# Patient Record
Sex: Female | Born: 1975 | Race: Black or African American | Hispanic: No | Marital: Single | Smoking: Never smoker
Health system: Southern US, Community
[De-identification: ages and names within clinical notes are randomized; demographics above are authoritative.]

## PROBLEM LIST (undated history)

## (undated) DIAGNOSIS — K219 Gastro-esophageal reflux disease without esophagitis: Secondary | ICD-10-CM

## (undated) DIAGNOSIS — F329 Major depressive disorder, single episode, unspecified: Secondary | ICD-10-CM

## (undated) DIAGNOSIS — K760 Fatty (change of) liver, not elsewhere classified: Secondary | ICD-10-CM

## (undated) DIAGNOSIS — G8929 Other chronic pain: Secondary | ICD-10-CM

## (undated) DIAGNOSIS — E119 Type 2 diabetes mellitus without complications: Secondary | ICD-10-CM

## (undated) DIAGNOSIS — R51 Headache: Secondary | ICD-10-CM

## (undated) DIAGNOSIS — J45909 Unspecified asthma, uncomplicated: Secondary | ICD-10-CM

## (undated) DIAGNOSIS — M549 Dorsalgia, unspecified: Secondary | ICD-10-CM

## (undated) DIAGNOSIS — R519 Headache, unspecified: Secondary | ICD-10-CM

## (undated) DIAGNOSIS — R87629 Unspecified abnormal cytological findings in specimens from vagina: Secondary | ICD-10-CM

## (undated) DIAGNOSIS — J302 Other seasonal allergic rhinitis: Secondary | ICD-10-CM

## (undated) DIAGNOSIS — I10 Essential (primary) hypertension: Secondary | ICD-10-CM

## (undated) DIAGNOSIS — G43909 Migraine, unspecified, not intractable, without status migrainosus: Secondary | ICD-10-CM

## (undated) DIAGNOSIS — G4733 Obstructive sleep apnea (adult) (pediatric): Secondary | ICD-10-CM

## (undated) DIAGNOSIS — F32A Depression, unspecified: Secondary | ICD-10-CM

## (undated) HISTORY — DX: Type 2 diabetes mellitus without complications: E11.9

## (undated) HISTORY — DX: Obstructive sleep apnea (adult) (pediatric): G47.33

## (undated) HISTORY — DX: Fatty (change of) liver, not elsewhere classified: K76.0

## (undated) HISTORY — DX: Dorsalgia, unspecified: M54.9

## (undated) HISTORY — DX: Migraine, unspecified, not intractable, without status migrainosus: G43.909

## (undated) HISTORY — PX: NO PAST SURGERIES: SHX2092

## (undated) HISTORY — PX: CHOLECYSTECTOMY: SHX55

## (undated) HISTORY — DX: Unspecified asthma, uncomplicated: J45.909

## (undated) HISTORY — DX: Other seasonal allergic rhinitis: J30.2

## (undated) HISTORY — DX: Unspecified abnormal cytological findings in specimens from vagina: R87.629

---

## 2009-08-08 ENCOUNTER — Emergency Department: Payer: Self-pay | Admitting: Unknown Physician Specialty

## 2011-03-21 ENCOUNTER — Emergency Department: Payer: Self-pay | Admitting: Emergency Medicine

## 2014-04-30 DIAGNOSIS — F4312 Post-traumatic stress disorder, chronic: Secondary | ICD-10-CM | POA: Insufficient documentation

## 2014-04-30 DIAGNOSIS — F4310 Post-traumatic stress disorder, unspecified: Secondary | ICD-10-CM | POA: Insufficient documentation

## 2015-03-02 DIAGNOSIS — F4323 Adjustment disorder with mixed anxiety and depressed mood: Secondary | ICD-10-CM | POA: Insufficient documentation

## 2015-05-25 LAB — HM HIV SCREENING LAB: HM HIV Screening: NEGATIVE

## 2016-03-01 ENCOUNTER — Emergency Department: Payer: No Typology Code available for payment source

## 2016-03-01 ENCOUNTER — Emergency Department
Admission: EM | Admit: 2016-03-01 | Discharge: 2016-03-01 | Disposition: A | Discharge status: Home / Self Care | Payer: No Typology Code available for payment source | Attending: Emergency Medicine | Admitting: Emergency Medicine

## 2016-03-01 ENCOUNTER — Encounter: Payer: Self-pay | Admitting: Emergency Medicine

## 2016-03-01 DIAGNOSIS — S39012A Strain of muscle, fascia and tendon of lower back, initial encounter: Secondary | ICD-10-CM | POA: Diagnosis not present

## 2016-03-01 DIAGNOSIS — S301XXA Contusion of abdominal wall, initial encounter: Secondary | ICD-10-CM | POA: Diagnosis not present

## 2016-03-01 DIAGNOSIS — Y939 Activity, unspecified: Secondary | ICD-10-CM | POA: Diagnosis not present

## 2016-03-01 DIAGNOSIS — Y92410 Unspecified street and highway as the place of occurrence of the external cause: Secondary | ICD-10-CM | POA: Diagnosis not present

## 2016-03-01 DIAGNOSIS — Y999 Unspecified external cause status: Secondary | ICD-10-CM | POA: Insufficient documentation

## 2016-03-01 DIAGNOSIS — G44319 Acute post-traumatic headache, not intractable: Secondary | ICD-10-CM | POA: Insufficient documentation

## 2016-03-01 DIAGNOSIS — S3992XA Unspecified injury of lower back, initial encounter: Secondary | ICD-10-CM | POA: Diagnosis present

## 2016-03-01 HISTORY — DX: Headache: R51

## 2016-03-01 HISTORY — DX: Other chronic pain: G89.29

## 2016-03-01 HISTORY — DX: Headache, unspecified: R51.9

## 2016-03-01 HISTORY — DX: Major depressive disorder, single episode, unspecified: F32.9

## 2016-03-01 HISTORY — DX: Depression, unspecified: F32.A

## 2016-03-01 HISTORY — DX: Gastro-esophageal reflux disease without esophagitis: K21.9

## 2016-03-01 LAB — POCT PREGNANCY, URINE: Preg Test, Ur: NEGATIVE

## 2016-03-01 MED ORDER — MELOXICAM 15 MG PO TABS: 15.0000 mg | ORAL_TABLET | Freq: Every day | ORAL | 0 refills | Status: DC

## 2016-03-01 MED ORDER — METHOCARBAMOL 500 MG PO TABS: 500.0000 mg | ORAL_TABLET | Freq: Four times a day (QID) | ORAL | 0 refills | Status: DC

## 2016-03-01 NOTE — ED Notes (Signed)
Discharge instructions reviewed with patient. Patient verbalized understanding. Patient ambulated to lobby without difficulty.   
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
## 2017-09-12 DIAGNOSIS — D219 Benign neoplasm of connective and other soft tissue, unspecified: Secondary | ICD-10-CM | POA: Insufficient documentation

## 2018-07-16 ENCOUNTER — Other Ambulatory Visit: Payer: Self-pay

## 2018-07-16 ENCOUNTER — Ambulatory Visit
Admission: EM | Admit: 2018-07-16 | Discharge: 2018-07-16 | Disposition: A | Discharge status: Home / Self Care | Payer: Self-pay | Attending: Family Medicine | Admitting: Family Medicine

## 2018-07-16 ENCOUNTER — Encounter: Payer: Self-pay | Admitting: Emergency Medicine

## 2018-07-16 DIAGNOSIS — I10 Essential (primary) hypertension: Secondary | ICD-10-CM

## 2018-07-16 DIAGNOSIS — R109 Unspecified abdominal pain: Secondary | ICD-10-CM | POA: Insufficient documentation

## 2018-07-16 DIAGNOSIS — R82998 Other abnormal findings in urine: Secondary | ICD-10-CM | POA: Insufficient documentation

## 2018-07-16 HISTORY — DX: Essential (primary) hypertension: I10

## 2018-07-16 LAB — URINALYSIS, COMPLETE (UACMP) WITH MICROSCOPIC
BACTERIA UA: NONE SEEN
BILIRUBIN URINE: NEGATIVE
Glucose, UA: NEGATIVE mg/dL
Hgb urine dipstick: NEGATIVE
KETONES UR: NEGATIVE mg/dL
Nitrite: NEGATIVE
RBC / HPF: NONE SEEN RBC/hpf (ref 0–5)
Specific Gravity, Urine: 1.02 (ref 1.005–1.030)
Squamous Epithelial / LPF: NONE SEEN (ref 0–5)
pH: 7 (ref 5.0–8.0)

## 2018-07-16 MED ORDER — HYDROCODONE-ACETAMINOPHEN 5-325 MG PO TABS: ORAL_TABLET | ORAL | 0 refills | Status: DC

## 2018-07-16 MED ORDER — TAMSULOSIN HCL 0.4 MG PO CAPS: 0.4000 mg | ORAL_CAPSULE | Freq: Every day | ORAL | 0 refills | Status: DC

## 2018-07-16 NOTE — ED Provider Notes (Signed)
[facility]    CSN: [id number] Arrival date & time: 07/16/18  1902     History   Chief Complaint Chief Complaint  Patient presents with  . Abdominal Pain    RUQ  . Hematuria    HPI Dawn Waller is a 42 y.o. female.   The history is provided by the patient.  Abdominal Pain  Pain location:  L flank and R flank Pain quality: aching   Pain radiates to:  Does not radiate Pain severity:  Moderate Onset quality:  Sudden Duration:  14 weeks Timing:  Constant Progression:  Unchanged Chronicity:  New Context: not alcohol use, not awakening from sleep, not diet changes, not eating, not laxative use, not medication withdrawal, not previous surgeries, not recent illness, not recent sexual activity, not recent travel, not retching, not sick contacts, not suspicious food intake and not trauma   Relieved by:  Position changes Associated symptoms: hematuria and nausea   Associated symptoms: no anorexia, no belching, no chest pain, no chills, no constipation, no cough, no diarrhea, no dysuria, no fatigue, no fever, no flatus, no hematemesis, no hematochezia, no melena, no shortness of breath, no sore throat, no vaginal bleeding, no vaginal discharge and no vomiting   Risk factors: obesity   Risk factors: no alcohol abuse, no aspirin use, not elderly, has not had multiple surgeries, no NSAID use, not pregnant and no recent hospitalization   Hematuria  Associated symptoms include abdominal pain. Pertinent negatives include no chest pain and no shortness of breath.    Past Medical History:  Diagnosis Date  . Chronic headaches   . Depression   . GERD (gastroesophageal reflux disease)   . Hypertension     There are no active problems to display for this patient.   Past Surgical History:  Procedure Laterality Date  . NO PAST SURGERIES      OB History   None      Home Medications    Prior to Admission medications   Medication Sig Start Date End Date  Taking? Authorizing Provider  albuterol (PROVENTIL HFA;VENTOLIN HFA) 108 (90 Base) MCG/ACT inhaler Inhale into the lungs.   Yes [provider]  medroxyPROGESTERone (DEPO-PROVERA) 150 MG/ML injection Inject 150 mg into the muscle every 3 (three) months.   Yes [provider]  verapamil (CALAN) 120 MG tablet Take 120 mg by mouth 2 (two) times daily.   Yes [provider]  HYDROcodone-acetaminophen (NORCO/VICODIN) 5-325 MG tablet 1-2 tabs po bid prn 07/16/18   Norval Gable, MD  meloxicam (MOBIC) 15 MG tablet Take 1 tablet (15 mg total) by mouth daily. 03/01/16   Cuthriell, Charline Bills, PA-C  methocarbamol (ROBAXIN) 500 MG tablet Take 1 tablet (500 mg total) by mouth 4 (four) times daily. 03/01/16   Cuthriell, Charline Bills, PA-C  tamsulosin (FLOMAX) 0.4 MG CAPS capsule Take 1 capsule (0.4 mg total) by mouth daily. 07/16/18   Norval Gable, MD    Family History Family History  Problem Relation Age of Onset  . Healthy Mother   . Healthy Father     Social History Social History   Tobacco Use  . Smoking status: Never Smoker  . Smokeless tobacco: Never Used  Substance Use Topics  . Alcohol use: Yes    Alcohol/week: 7.0 standard drinks    Types: 7 Glasses of wine per week    Comment: wine or vodka mixed with soda  . Drug use: No     Allergies   Patient has  no known allergies.   Review of Systems Review of Systems  Constitutional: Negative for chills, fatigue and fever.  HENT: Negative for sore throat.   Respiratory: Negative for cough and shortness of breath.   Cardiovascular: Negative for chest pain.  Gastrointestinal: Positive for abdominal pain and nausea. Negative for anorexia, constipation, diarrhea, flatus, hematemesis, hematochezia, melena and vomiting.  Genitourinary: Positive for hematuria. Negative for dysuria, vaginal bleeding and vaginal discharge.     Physical Exam Triage Vital Signs ED Triage Vitals  Enc Vitals Group     BP 07/16/18  1923 (!) 144/111     Pulse Rate 07/16/18 1923 86     Resp 07/16/18 1923 16     Temp 07/16/18 1923 99.2 F (37.3 C)     Temp Source 07/16/18 1923 Oral     SpO2 07/16/18 1923 100 %     Weight 07/16/18 1924 194 lb (88 kg)     Height 07/16/18 1924 5\' 5"  (1.651 m)     Head Circumference --      Peak Flow --      Pain Score 07/16/18 1923 6     Pain Loc --      Pain Edu? --      Excl. in [place]? --    No data found.  Updated Vital Signs BP (!) 145/109 (BP Location: Right Arm)   Pulse 86   Temp 99.2 F (37.3 C) (Oral)   Resp 16   Ht 5\' 5"  (1.651 m)   Wt 88 kg   SpO2 100%   BMI 32.28 kg/m   Visual Acuity Right Eye Distance:   Left Eye Distance:   Bilateral Distance:    Right Eye Near:   Left Eye Near:    Bilateral Near:     Physical Exam  Constitutional: She appears well-developed and well-nourished. No distress.  Abdominal: Soft. Bowel sounds are normal. She exhibits no distension and no mass. There is tenderness (bilateral flank). There is no rebound and no guarding.  Skin: She is not diaphoretic.  Nursing note and vitals reviewed.    UC Treatments / Results  Labs (all labs ordered are listed, but only abnormal results are displayed) Labs Reviewed  URINALYSIS, COMPLETE (UACMP) WITH MICROSCOPIC - Abnormal; Notable for the following components:      Result Value   Protein, ur TRACE (*)    Leukocytes, UA TRACE (*)    All other components within normal limits    EKG None  Radiology No results found.  Procedures Procedures (including critical care time)  Medications Ordered in UC Medications - No data to display  Initial Impression / Assessment and Plan / UC Course  I have reviewed the triage vital signs and the nursing notes.  Pertinent labs & imaging results that were available during my care of the patient were reviewed by me and considered in my medical decision making (see chart for details).      Final Clinical Impressions(s) / UC Diagnoses    Final diagnoses:  Flank pain  Crystalluria    ED Prescriptions    Medication Sig Dispense Auth. Provider   tamsulosin (FLOMAX) 0.4 MG CAPS capsule Take 1 capsule (0.4 mg total) by mouth daily. 30 capsule Norval Gable, MD   HYDROcodone-acetaminophen (NORCO/VICODIN) 5-325 MG tablet  (Status: Discontinued) 1-2 tabs po bid prn 6 tablet Kristopher Attwood, Linward Foster, MD   HYDROcodone-acetaminophen (NORCO/VICODIN) 5-325 MG tablet  (Status: Discontinued) 1-2 tabs po bid prn 10 tablet Norval Gable, MD   HYDROcodone-acetaminophen (NORCO/VICODIN)  5-325 MG tablet 1-2 tabs po bid prn 10 tablet Norval Gable, MD     1. Lab result and diagnosis reviewed with patient 2. rx as per orders above; reviewed possible side effects, interactions, risks and benefits  3. Recommend supportive treatment with increased water intake 4. Follow-up with PCP 5. Recommend go to ED if symptoms worsen   Controlled Substance Prescriptions [place] Controlled Substance Registry consulted? Not Applicable   Norval Gable, MD 07/16/18 2018

## 2018-07-16 NOTE — ED Triage Notes (Addendum)
Patient in today c/o headache, nausea since yesterday. RUQ pain x "a few months" and hematuria x "a few months". Patient denies fever. Patient has tried OTC Tylenol.

## 2018-08-03 ENCOUNTER — Ambulatory Visit
Admission: EM | Admit: 2018-08-03 | Discharge: 2018-08-03 | Disposition: A | Discharge status: Home / Self Care | Payer: Self-pay | Attending: Family Medicine | Admitting: Family Medicine

## 2018-08-03 ENCOUNTER — Other Ambulatory Visit: Payer: Self-pay

## 2018-08-03 ENCOUNTER — Encounter: Payer: Self-pay | Admitting: Gynecology

## 2018-08-03 DIAGNOSIS — B9789 Other viral agents as the cause of diseases classified elsewhere: Secondary | ICD-10-CM | POA: Insufficient documentation

## 2018-08-03 DIAGNOSIS — J069 Acute upper respiratory infection, unspecified: Secondary | ICD-10-CM | POA: Insufficient documentation

## 2018-08-03 DIAGNOSIS — J45901 Unspecified asthma with (acute) exacerbation: Secondary | ICD-10-CM | POA: Insufficient documentation

## 2018-08-03 DIAGNOSIS — R05 Cough: Secondary | ICD-10-CM

## 2018-08-03 DIAGNOSIS — R0981 Nasal congestion: Secondary | ICD-10-CM

## 2018-08-03 MED ORDER — DOXYCYCLINE HYCLATE 100 MG PO CAPS: 100.0000 mg | ORAL_CAPSULE | Freq: Two times a day (BID) | ORAL | 0 refills | Status: DC

## 2018-08-03 MED ORDER — PREDNISONE 10 MG PO TABS: ORAL_TABLET | ORAL | 0 refills | Status: DC

## 2018-08-03 NOTE — ED Provider Notes (Signed)
[facility] ____________________________________________  Time seen: Approximately 3:29 PM  I have reviewed the triage vital signs and the nursing notes.   HISTORY  Chief Complaint No chief complaint on file.  HPI MAELEY MATTON is a 42 y.o. female presenting for evaluation of 1 week of runny nose, nasal congestion, cough and chest congestion.  Reports cough is her biggest complaint.  States she was able to go to work on Thursday and Friday, but reports cough is disrupting her sleep and making her feel worse.  Occasionally hears herself wheeze.  Does report history of asthma.  Denies known fevers.  Occasional scratchy sore throat.  Has continued to eat and drink well.  Unresolved with over-the-counter cough congestion medications, reports does help some.  Also reports has some Tessalon Perles at home which helps some.  Last use albuterol inhaler and nebulizer early this morning, and reports that it did help some.  Denies chest pain or shortness of breath.  Has continued remain active.  Denies other aggravating alleviating factors.  No LMP recorded. Patient has had an injection.   Past Medical History:  Diagnosis Date  . Chronic headaches   . Depression   . GERD (gastroesophageal reflux disease)   . Hypertension     There are no active problems to display for this patient.   Past Surgical History:  Procedure Laterality Date  . NO PAST SURGERIES       No current facility-administered medications for this encounter.   Current Outpatient Medications:  .  albuterol (PROVENTIL HFA;VENTOLIN HFA) 108 (90 Base) MCG/ACT inhaler, Inhale into the lungs., Disp: , Rfl:  .  medroxyPROGESTERone (DEPO-PROVERA) 150 MG/ML injection, Inject 150 mg into the muscle every 3 (three) months., Disp: , Rfl:  .  verapamil (CALAN) 120 MG tablet, Take 120 mg by mouth 2 (two) times daily., Disp: , Rfl:  .  doxycycline (VIBRAMYCIN) 100 MG capsule, Take 1 capsule (100 mg total) by mouth 2  (two) times daily., Disp: 20 capsule, Rfl: 0 .  predniSONE (DELTASONE) 10 MG tablet, Start 60 mg po day one, then 50 mg po day two, taper by 10 mg daily until complete., Disp: 21 tablet, Rfl: 0  Allergies Patient has no known allergies.  Family History  Problem Relation Age of Onset  . Healthy Mother   . Healthy Father     Social History Social History   Tobacco Use  . Smoking status: Never Smoker  . Smokeless tobacco: Never Used  Substance Use Topics  . Alcohol use: Yes    Alcohol/week: 7.0 standard drinks    Types: 7 Glasses of wine per week    Comment: wine or vodka mixed with soda  . Drug use: No    Review of Systems Constitutional: No fever ENT: As above. Cardiovascular: Denies chest pain. Respiratory: Denies shortness of breath. Gastrointestinal: No abdominal pain.  Musculoskeletal: Negative for back pain. Skin: Negative for rash.  ____________________________________________   PHYSICAL EXAM:  VITAL SIGNS: ED Triage Vitals  Enc Vitals Group     BP 08/03/18 1438 (!) 152/99     Pulse Rate 08/03/18 1438 95     Resp 08/03/18 1438 16     Temp 08/03/18 1438 98.9 F (37.2 C)     Temp Source 08/03/18 1438 Oral     SpO2 08/03/18 1438 100 %     Weight 08/03/18 1439 194 lb (88 kg)     Height --      Head Circumference --  Peak Flow --      Pain Score 08/03/18 1439 0     Pain Loc --      Pain Edu? --      Excl. in [place]? --    Constitutional: Alert and oriented. Well appearing and in no acute distress. Eyes: Conjunctivae are normal.  Head: Atraumatic. No sinus tenderness to palpation. No swelling. No erythema.  Ears: no erythema, normal TMs bilaterally.   Nose:Nasal congestion with clear rhinorrhea  Mouth/Throat: Mucous membranes are moist. No pharyngeal erythema. No tonsillar swelling or exudate.  Neck: No stridor.  No cervical spine tenderness to palpation. Hematological/Lymphatic/Immunilogical: No cervical lymphadenopathy. Cardiovascular: Normal rate,  regular rhythm. Grossly normal heart sounds.  Good peripheral circulation. Respiratory: Normal respiratory effort.  No retractions.  Minimal scattered rhonchi.  No focal consolidation auscultated.  Mild scattered inspiratory and expiratory wheezes.  Good air movement.  Dry intermittent cough in room.  Speaks in complete sentences. Musculoskeletal: Ambulatory with steady gait. No cervical, thoracic or lumbar tenderness to palpation.  No lower extremity edema noted bilaterally. Neurologic:  Normal speech and language. No gait instability. Skin:  Skin appears warm, dry and intact. No rash noted. Psychiatric: Mood and affect are normal. Speech and behavior are normal.  ___________________________________________   LABS (all labs ordered are listed, but only abnormal results are displayed)  Labs Reviewed - No data to display  PROCEDURES Procedures     INITIAL IMPRESSION / ASSESSMENT AND PLAN / ED COURSE  Pertinent labs & imaging results that were available during my care of the patient were reviewed by me and considered in my medical decision making (see chart for details).  Well-appearing patient.  No acute distress.  Suspect viral upper respiratory infection with acute asthma exacerbation.  Will treat with oral doxycycline, prednisone taper, continue home albuterol as well as home Tessalon Perles as needed.  Encourage rest, fluids, supportive care.Discussed indication, risks and benefits of medications with patient.  Discussed follow up with Primary care physician this week. Discussed follow up and return parameters including no resolution or any worsening concerns. Patient verbalized understanding and agreed to plan.   ____________________________________________   FINAL CLINICAL IMPRESSION(S) / ED DIAGNOSES  Final diagnoses:  Viral URI with cough  Asthma with acute exacerbation, unspecified asthma severity, unspecified whether persistent     ED Discharge Orders         Ordered      predniSONE (DELTASONE) 10 MG tablet     08/03/18 1528    doxycycline (VIBRAMYCIN) 100 MG capsule  2 times daily     08/03/18 1528           Note: This dictation was prepared with Dragon dictation along with smaller phrase technology. Any transcriptional errors that result from this process are unintentional.         Marylene Land, NP 08/03/18 1747

## 2018-08-03 NOTE — Discharge Instructions (Addendum)
Take medication as prescribed. Rest. Drink plenty of fluids.  Continue home albuterol treatments, as well as cough medication as needed.  Follow up with your primary care physician this week as needed. Return to Urgent care for new or worsening concerns.

## 2018-08-03 NOTE — ED Triage Notes (Signed)
Patient c/o cough x few days. Per patient with chest congestion. Pt. Also stated hx of asthma.

## 2018-08-28 LAB — HM PAP SMEAR

## 2019-03-14 DIAGNOSIS — F4323 Adjustment disorder with mixed anxiety and depressed mood: Secondary | ICD-10-CM

## 2019-03-14 DIAGNOSIS — F4310 Post-traumatic stress disorder, unspecified: Secondary | ICD-10-CM

## 2019-03-17 ENCOUNTER — Other Ambulatory Visit: Payer: Self-pay

## 2019-03-17 ENCOUNTER — Ambulatory Visit (LOCAL_COMMUNITY_HEALTH_CENTER): Payer: Self-pay

## 2019-03-17 VITALS — BP 134/105 | Ht 65.0 in | Wt 199.5 lb

## 2019-03-17 DIAGNOSIS — Z30013 Encounter for initial prescription of injectable contraceptive: Secondary | ICD-10-CM

## 2019-03-17 DIAGNOSIS — Z3009 Encounter for other general counseling and advice on contraception: Secondary | ICD-10-CM

## 2019-03-17 MED ORDER — MULTI-VITAMIN/MINERALS PO TABS: 1.0000 | ORAL_TABLET | Freq: Every day | ORAL | 0 refills | Status: AC

## 2019-03-17 MED ORDER — MEDROXYPROGESTERONE ACETATE 150 MG/ML IM SUSP
150.0000 mg | Freq: Once | INTRAMUSCULAR | Status: AC
  Administered 2019-03-17: 15:00:00 150 mg via INTRAMUSCULAR

## 2019-03-17 NOTE — Progress Notes (Signed)
Consult with Antoine Primas PA as BP 134/105 (large cuff used). Client takes Verapamil in the evenings. Client counseled to contact PCP today due to elevated BP. Per Antoine Primas PA, administeer Depo 150mg  IM today (as per 08/28/2018 order written by Hassell Done NP). Client tolerated Depo injection without difficulty. Shona Needles, RN

## 2019-06-13 ENCOUNTER — Other Ambulatory Visit: Payer: Self-pay

## 2019-06-13 ENCOUNTER — Ambulatory Visit (LOCAL_COMMUNITY_HEALTH_CENTER): Payer: Self-pay

## 2019-06-13 VITALS — BP 146/101 | Ht 65.0 in | Wt 210.0 lb

## 2019-06-13 DIAGNOSIS — Z3009 Encounter for other general counseling and advice on contraception: Secondary | ICD-10-CM

## 2019-06-13 DIAGNOSIS — Z30013 Encounter for initial prescription of injectable contraceptive: Secondary | ICD-10-CM

## 2019-06-13 MED ORDER — MEDROXYPROGESTERONE ACETATE 150 MG/ML IM SUSP
150.0000 mg | Freq: Once | INTRAMUSCULAR | Status: AC
  Administered 2019-06-13: 150 mg via INTRAMUSCULAR

## 2019-06-13 NOTE — Progress Notes (Signed)
Last physical at [facility] was 08/28/2018. Last depo at [facility] 03/17/2019; 12.4 weeks post depo today. Reference DMPA 150 mg IM order for depo for 1 year by Hassell Done, FNP at physical dated 08/28/2018. Pt denies headache, blurred vision, dizziness. Consulted with provider regarding pt BP. Per verbal order by Donnal Moat, CNM, administered DMPA 150 mg IM today, gave pt list of BP readings at [facility] dating back to 05/31/2018, and provided list of PCPs, counseled pt about some possible complications of high blood pressure. Pt plans to call [facility] for appt.

## 2019-09-11 ENCOUNTER — Other Ambulatory Visit: Payer: Self-pay | Admitting: Physician Assistant

## 2019-09-11 ENCOUNTER — Other Ambulatory Visit: Payer: Self-pay

## 2019-09-11 ENCOUNTER — Ambulatory Visit (LOCAL_COMMUNITY_HEALTH_CENTER): Payer: Self-pay

## 2019-09-11 VITALS — BP 148/104 | Ht 65.0 in | Wt 214.0 lb

## 2019-09-11 DIAGNOSIS — Z3042 Encounter for surveillance of injectable contraceptive: Secondary | ICD-10-CM

## 2019-09-11 DIAGNOSIS — Z30013 Encounter for initial prescription of injectable contraceptive: Secondary | ICD-10-CM

## 2019-09-11 DIAGNOSIS — Z3009 Encounter for other general counseling and advice on contraception: Secondary | ICD-10-CM

## 2019-09-11 MED ORDER — MEDROXYPROGESTERONE ACETATE 150 MG/ML IM SUSP
150.0000 mg | Freq: Once | INTRAMUSCULAR | Status: AC
  Administered 2019-09-11: 150 mg via INTRAMUSCULAR

## 2019-09-11 NOTE — Progress Notes (Signed)
12.6 weeks post depo today. )See DMPA 150 mg IM today per Antoine Primas, PA order dated 09/11/2019 for one time depo.)  Has not been seen by PCP for BP. Denies headache, blurred vision, dizziness, & blood in urine. Consulted with provider regarding pt's BP; per verbal order by Antoine Primas, PA, administered DMPA 150 mg today and pt counseled to see PCP regarding BP; pt to schedule provider visit for PAP when next depo is due.

## 2019-09-11 NOTE — Progress Notes (Signed)
Per chart, last RP 08/2018.  Pap due ASAP due to pap 2020 neg/HPV positive.  Provided that patient desires to continue with Depo and BP is normal, may have Depo at appt 09/2019.  Patient needs to RTC for provider visit in April when next Depo is due for pap.

## 2019-10-20 ENCOUNTER — Telehealth: Payer: Self-pay

## 2019-10-21 NOTE — Telephone Encounter (Signed)
TC from patient.  Informed time for repeat pap. Scheduled 10/23/19 Aileen Fass, RN

## 2019-10-23 ENCOUNTER — Ambulatory Visit: Payer: Self-pay

## 2019-11-06 ENCOUNTER — Other Ambulatory Visit: Payer: Self-pay

## 2019-11-06 ENCOUNTER — Ambulatory Visit (LOCAL_COMMUNITY_HEALTH_CENTER): Payer: Self-pay | Admitting: Physician Assistant

## 2019-11-06 ENCOUNTER — Other Ambulatory Visit: Payer: Self-pay | Admitting: Physician Assistant

## 2019-11-06 DIAGNOSIS — R87810 Cervical high risk human papillomavirus (HPV) DNA test positive: Secondary | ICD-10-CM | POA: Insufficient documentation

## 2019-11-06 DIAGNOSIS — Z309 Encounter for contraceptive management, unspecified: Secondary | ICD-10-CM

## 2019-11-06 NOTE — Progress Notes (Signed)
   [place] problem visit  [facility]  Subjective:  Dawn Waller is a 44 y.o. being seen today for   Chief Complaint  Patient presents with  . Contraception    Pap    44 yo woman here as instructed for repeat cervical cancer cotesting. Last testing 08/28/18 with Pap = neg and HRHPV pos. On DMPA for contraception, next injection due in about 4 weeks. No other concerns.    Does the patient have a current or past history of drug use? No   No components found for: HCV]   Health Maintenance Due  Topic Date Due  . TETANUS/TDAP  Never done  . PAP SMEAR-Modifier  08/29/2019    ROS  The following portions of the patient's history were reviewed and updated as appropriate: allergies, current medications, past family history, past medical history, past social history, past surgical history and problem list. Problem list updated.   See flowsheet for other program required questions.  Objective:  There were no vitals filed for this visit.  Physical Exam Constitutional:      Appearance: She is obese.  Pulmonary:     Effort: Pulmonary effort is normal.  Genitourinary:    General: Normal vulva.     Labia:        Right: No rash or lesion.        Left: No rash or lesion.      Urethra: No urethral swelling or urethral lesion.     Vagina: Vaginal discharge present. No tenderness, bleeding or lesions.     Cervix: No cervical motion tenderness, discharge, friability, lesion, erythema or eversion.     Uterus: Not enlarged and not tender.      Adnexa:        Right: No tenderness.         Left: No tenderness.       Rectum: No external hemorrhoid.     Comments: sm amt adherent creamy white vag discharge without odor. Lymphadenopathy:     Lower Body: No right inguinal adenopathy. No left inguinal adenopathy.  Neurological:     Mental Status: She is alert and oriented to person, place, and time.  Psychiatric:        Behavior: Behavior  normal.        Thought Content: Thought content normal.        Judgment: Judgment normal.       Assessment and Plan:  Dawn Waller is a 44 y.o. female presenting to the [facility] Department for a Women's Health problem visit  1. Cervical high risk HPV (human papillomavirus) test positive Cervical cancer cotesting with Pap/HRHPV today. F/u pending results.  2. Encounter for contraceptive management, unspecified type To continue routine DMPA injections as planned.     Return for Routine DMPA injection.  No future appointments.  Lora Havens, PA-C

## 2019-11-06 NOTE — Progress Notes (Signed)
In for Pap; declines HIV/RPR testing Dawn Lat, RN

## 2019-11-11 LAB — IGP, APTIMA HPV
HPV Aptima: NEGATIVE
PAP Smear Comment: 0

## 2019-11-20 ENCOUNTER — Ambulatory Visit: Payer: Self-pay

## 2019-12-10 ENCOUNTER — Other Ambulatory Visit: Payer: Self-pay

## 2019-12-10 ENCOUNTER — Encounter (HOSPITAL_COMMUNITY): Payer: Self-pay | Admitting: Licensed Clinical Social Worker

## 2019-12-10 ENCOUNTER — Ambulatory Visit (INDEPENDENT_AMBULATORY_CARE_PROVIDER_SITE_OTHER): Payer: No Typology Code available for payment source | Admitting: Licensed Clinical Social Worker

## 2019-12-10 DIAGNOSIS — F331 Major depressive disorder, recurrent, moderate: Secondary | ICD-10-CM | POA: Diagnosis not present

## 2019-12-10 DIAGNOSIS — F1010 Alcohol abuse, uncomplicated: Secondary | ICD-10-CM

## 2019-12-10 DIAGNOSIS — F411 Generalized anxiety disorder: Secondary | ICD-10-CM

## 2019-12-10 DIAGNOSIS — F4310 Post-traumatic stress disorder, unspecified: Secondary | ICD-10-CM | POA: Diagnosis not present

## 2019-12-10 DIAGNOSIS — F4321 Adjustment disorder with depressed mood: Secondary | ICD-10-CM

## 2019-12-10 NOTE — Progress Notes (Signed)
Comprehensive Clinical Assessment (CCA) Note  12/10/2019 Dawn Waller [id number]  Visit Diagnosis:      ICD-10-CM   1. MDD (major depressive disorder), recurrent episode, moderate (HCC)  F33.1   2. Generalized anxiety disorder  F41.1   3. Alcohol use disorder, mild, abuse  F10.10   4. PTSD (post-traumatic stress disorder)  F43.10   5. Grief  F43.21       CCA Part One  Part One has been completed on paper by the patient.  (See scanned document in Chart Review)  CCA Part Two A  Intake/Chief Complaint:  CCA Intake With Chief Complaint CCA Part Two Date: 12/10/19 CCA Part Two Time: 1523 Chief Complaint/Presenting Problem: Pt is referred by [place]: grief, anxiety, depression, PTSD, Alcohol use disorder Patients Currently Reported Symptoms/Problems: grief, sadness, isolating, lack of focus Collateral Involvement: [facility] referral Individual's Strengths: wants help Individual's Preferences: to feel better  Mental Health Symptoms Depression:  Depression: Change in energy/activity, Difficulty Concentrating, Fatigue, Worthlessness, Increase/decrease in appetite, Irritability, Tearfulness, Sleep (too much or little)  Mania:     Anxiety:   Anxiety: Difficulty concentrating, Irritability, Restlessness, Sleep, Tension, Worrying  Psychosis:  Psychosis: N/A  Trauma:  Trauma: Avoids reminders of event, Emotional numbing, Hypervigilance, Irritability/anger, Difficulty staying/falling asleep, Detachment from others  Obsessions:  Obsessions: Attempts to suppress/neutralize, Disrupts routine/functioning, Intrusive/time consuming  Compulsions:  Compulsions: Disrupts with routine/functioning, Intrusive/time consuming  Inattention:  Inattention: N/A  Hyperactivity/Impulsivity:  Hyperactivity/Impulsivity: N/A  Oppositional/Defiant Behaviors:  Oppositional/Defiant Behaviors: N/A  Borderline Personality:  Emotional Irregularity: N/A  Other Mood/Personality Symptoms:      Mental Status  Exam Appearance and self-care  Stature:  Stature: Average  Weight:  Weight: Average weight  Clothing:  Clothing: Casual  Grooming:  Grooming: Normal  Cosmetic use:  Cosmetic Use: None  Posture/gait:  Posture/Gait: Normal  Motor activity:  Motor Activity: Not Remarkable  Sensorium  Attention:  Attention: Normal  Concentration:  Concentration: Anxiety interferes  Orientation:  Orientation: X5  Recall/memory:  Recall/Memory: Defective in short-term  Affect and Mood  Affect:  Affect: Anxious, Depressed  Mood:  Mood: Anxious  Relating  Eye contact:     Facial expression:  Facial Expression: Depressed  Attitude toward examiner:  Attitude Toward Examiner: Cooperative  Thought and Language  Speech flow: Speech Flow: Normal  Thought content:  Thought Content: Appropriate to mood and circumstances  Preoccupation:     Hallucinations:     Organization:     Transport planner of Knowledge:  Fund of Knowledge: Impoverished by:  (Comment)  Intelligence:  Intelligence: Average  Abstraction:  Abstraction: Normal  Judgement:  Judgement: Fair  Art therapist:  Reality Testing: Realistic  Insight:  Insight: Fair  Decision Making:  Decision Making: Impulsive  Social Functioning  Social Maturity:  Social Maturity: Isolates  Social Judgement:  Social Judgement: Normal  Stress  Stressors:  Stressors: Grief/losses, Illness  Coping Ability:  Coping Ability: Deficient supports  Skill Deficits:     Supports:      Family and Psychosocial History: Family history Marital status: Single Does patient have children?: No  Childhood History:  Childhood History Additional childhood history information: I was raised by a woman I called my grandmother no relation by blood. My mother left me with her when i was 18 months old. Description of patient's relationship with caregiver when they were a child: good she was like my mom Patient's description of current relationship with people who raised  him/her: She passed away How were  you disciplined when you got in trouble as a child/adolescent?: stern talking to Does patient have siblings?: No Did patient suffer any verbal/emotional/physical/sexual abuse as a child?: No Did patient suffer from severe childhood neglect?: No Has patient ever been sexually abused/assaulted/raped as an adolescent or adult?: No Was the patient ever a victim of a crime or a disaster?: No Witnessed domestic violence?: No Has patient been effected by domestic violence as an adult?: No  CCA Part Two B  Employment/Work Situation: Employment / Work Copywriter, advertising Employment situation: Employed Where is patient currently employed?: [employer] How long has patient been employed?: 7 years Patient's job has been impacted by current illness: No What is the longest time patient has a held a job?: [employer] Where was the patient employed at that time?: 9 years Did You Receive Any Psychiatric Treatment/Services While in the [employer]?: No Are There Guns or Other Weapons in [place]?: No  Education: Education Did Teacher, adult education From [employer]?: Yes Did Physicist, medical?: No Did Heritage manager?: No Did You Have An Individualized Education Program (IIEP): No Did You Have Any Difficulty At [employer]?: No  Religion: Religion/Spirituality Are You A Religious Person?: Yes  Leisure/Recreation: Leisure / Recreation Leisure and Hobbies: reading, writing poetry, walking in the park  Exercise/Diet: Exercise/Diet Do You Exercise?: No Have You Gained or Lost A Significant Amount of Weight in the Past Six Months?: No Do You Follow a Special Diet?: No Do You Have Any Trouble Sleeping?: Yes Explanation of Sleeping Difficulties: staying asleep, falling asleep  CCA Part Two C  Alcohol/Drug Use: Alcohol / Drug Use History of alcohol / drug use?: Yes Substance #1 Name of Substance 1: alcohol 1 - Frequency: started drinking daily after father  passed away 08/14/23 1 - Duration: 2 glasses wine, mixed drinks 1 - Last Use / Amount: 12/09/19                    CCA Part Three  ASAM's:  Six Dimensions of Multidimensional Assessment  Dimension 1:  Acute Intoxication and/or Withdrawal Potential:     Dimension 2:  Biomedical Conditions and Complications:     Dimension 3:  Emotional, Behavioral, or Cognitive Conditions and Complications:     Dimension 4:  Readiness to Change:     Dimension 5:  Relapse, Continued use, or Continued Problem Potential:     Dimension 6:  Recovery/Living Environment:      Substance use Disorder (SUD)    Social Function:  Social Functioning Social Maturity: Isolates Social Judgement: Normal  Stress:  Stress Stressors: Grief/losses, Illness Coping Ability: Deficient supports Patient Takes Medications The Way The Doctor Instructed?: Yes Priority Risk: Low Acuity  Risk Assessment- Self-Harm Potential: Risk Assessment For Self-Harm Potential Method: No plan Availability of Means: No access/NA  Risk Assessment -Dangerous to Others Potential: Risk Assessment For Dangerous to Others Potential Method: No Plan Availability of Means: No access or NA Intent: Vague intent or NA Notification Required: No need or identified person  DSM5 Diagnoses: Patient Active Problem List   Diagnosis Date Noted  . Cervical high risk HPV (human papillomavirus) test positive 11/06/2019  . Adjustment disorder with mixed anxiety and depressed mood 03/02/2015  . Posttraumatic stress disorder 04/30/2014    Patient Centered Plan: Patient is on the following Treatment Plan(s):  Depression, anxiety, grief, ptsd  Recommendations for Services/Supports/Treatments: Recommendations for Services/Supports/Treatments Recommendations For Services/Supports/Treatments: Individual Therapy  Treatment Plan Summary: OP Treatment Plan Summary: I don't want to feel  like this  Referrals to Alternative Service(s): Referred to  Alternative Service(s):   Place:   Date:   Time:    Referred to Alternative Service(s):   Place:   Date:   Time:    Referred to Alternative Service(s):   Place:   Date:   Time:    Referred to Alternative Service(s):   Place:   Date:   Time:     Jenkins [place]

## 2019-12-16 ENCOUNTER — Ambulatory Visit: Payer: Self-pay

## 2019-12-16 ENCOUNTER — Other Ambulatory Visit: Payer: Self-pay

## 2019-12-16 ENCOUNTER — Ambulatory Visit (LOCAL_COMMUNITY_HEALTH_CENTER): Payer: No Typology Code available for payment source | Admitting: Family Medicine

## 2019-12-16 VITALS — BP 144/103 | Ht 65.0 in | Wt 203.8 lb

## 2019-12-16 DIAGNOSIS — I10 Essential (primary) hypertension: Secondary | ICD-10-CM | POA: Diagnosis not present

## 2019-12-16 DIAGNOSIS — Z3009 Encounter for other general counseling and advice on contraception: Secondary | ICD-10-CM

## 2019-12-16 DIAGNOSIS — Z30013 Encounter for initial prescription of injectable contraceptive: Secondary | ICD-10-CM

## 2019-12-16 MED ORDER — MEDROXYPROGESTERONE ACETATE 150 MG/ML IM SUSP
150.0000 mg | INTRAMUSCULAR | Status: AC
  Administered 2019-12-16 – 2020-08-30 (×4): 150 mg via INTRAMUSCULAR

## 2019-12-16 NOTE — Progress Notes (Signed)
Pt tearful when talks about her father, death by motor vehicle in 2019/08/25. 13.5 weeks post depo today.

## 2019-12-16 NOTE — Progress Notes (Signed)
Provider orders completed. 

## 2019-12-16 NOTE — Progress Notes (Signed)
Family Planning Visit- Initial Visit  Subjective:  Dawn Waller is a 44 y.o.  [id number]  being seen today for an initial well woman visit and to discuss family planning options. Patient reports they do not want a pregnancy in the next year.   Chief Complaint  Patient presents with  . Annual Exam  . Contraception    depo    Pt has Adjustment disorder with mixed anxiety and depressed mood; Posttraumatic stress disorder; Cervical high risk HPV (human papillomavirus) test positive; Fibroids; and Hypertension on their problem list.   HPI  Patient reports here for depo and PE.  Pt denies all of the following, which are contraindications to Depo use: Known breast cancer Pregnancy Also denies: Severe cirrhosis, hepatocellular adenoma Diabetes with nephrosis or vascular complications Ischemic heart disease or multiple risk factors for atherosclerotic disease, and some forms of lupus Unexplained vaginal bleeding Pregnancy planned within the next year Long-term use of corticosteroid therapy in women with a history of, or risk factors for, nontraumatic (frailty) fractures.  Current use of aminoglutethimide (usually for the treatment of Cushing's syndrome) because aminoglutethimide may increase metabolism of progestins She does have HTN, takes medication for this, has taken today.     No LMP recorded. Patient has had an injection. BCM: depo Pt desires EC? N/a   Last pap: 08/28/2018: NILM, HR HPV+. 11/06/19 = NIL, HPV neg  Last breast exam: 1 yr ago Personal/family hx breast cancer? no  Patient reports 1 partner(s) in last year. Do they desire STI screening (if no, why not)? No, declines  Does the patient desire a pregnancy in the next year? no   44 y.o., Body mass index is 33.91 kg/m. - Is patient eligible for HA1C diabetes screening based on BMI and age >86?  Yes, pt declines  Has patient been screened once for HCV in the past?  no  No results found for: HCVAB  Does the  patient have current of drug use, have a partner with drug use, and/or has been incarcerated since last result? no If yes-- Screen for HCV through [facility]   Does the patient meet criteria for HBV testing? no  Criteria:  -Household, sexual or needle sharing contact with HBV -History of drug use -HIV positive -Those with known Hep C  See flowsheet for other program required questions.    Health Maintenance Due  Topic Date Due  . COVID-19 Vaccine (1) Never done  . TETANUS/TDAP  Never done  Declines tetanus today  ROS HA: chronic, has not yet seen PCP for this   The following portions of the patient's history were reviewed and updated as appropriate: allergies, current medications, past family history, past medical history, past social history, past surgical history and problem list. Problem list updated.   See flowsheet for other program required questions.  Objective:   Vitals:   12/16/19 1622  BP: (!) 144/103  Weight: 203 lb 12.8 oz (92.4 kg)  Height: 5\' 5"  (1.651 m)    Physical Exam Vitals and nursing note reviewed.  Constitutional:      Appearance: Normal appearance.  HENT:     Head: Normocephalic and atraumatic.     Mouth/Throat:     Mouth: Mucous membranes are moist.     Pharynx: Oropharynx is clear. No oropharyngeal exudate or posterior oropharyngeal erythema.  Eyes:     Conjunctiva/sclera: Conjunctivae normal.  Neck:     Thyroid: No thyroid mass, thyromegaly or thyroid tenderness.  Cardiovascular:  Rate and Rhythm: Normal rate and regular rhythm.     Pulses: Normal pulses.     Heart sounds: Normal heart sounds.  Pulmonary:     Effort: Pulmonary effort is normal.     Breath sounds: Normal breath sounds.  Chest:     Breasts:        Right: Normal. No swelling, mass, nipple discharge, skin change or tenderness.        Left: Normal. No swelling, mass, nipple discharge, skin change or tenderness.  Abdominal:     General: Abdomen is flat.      Palpations: There is no mass.     Tenderness: There is no abdominal tenderness. There is no rebound.  Lymphadenopathy:     Head:     Right side of head: No preauricular or posterior auricular adenopathy.     Left side of head: No preauricular or posterior auricular adenopathy.     Cervical: No cervical adenopathy.     Upper Body:     Right upper body: No supraclavicular or axillary adenopathy.     Left upper body: No supraclavicular or axillary adenopathy.  Skin:    General: Skin is warm and dry.     Findings: No rash.  Neurological:     Mental Status: She is alert and oriented to person, place, and time.      Assessment and Plan:  Dawn Waller is a 44 y.o. female presenting to the [facility] Department for an initial well woman exam/family planning visit.  Contraception counseling: Reviewed all forms of birth control options in the tiered based approach. available including abstinence; over the counter/barrier methods; hormonal contraceptive medication including pill, patch, ring, injection,contraceptive implant, ECP; hormonal and nonhormonal IUDs; permanent sterilization options including vasectomy and the various tubal sterilization modalities. Risks, benefits, and typical effectiveness rates were reviewed.  Questions were answered.  Written information was also given to the patient to review.  Patient desires depo, this was prescribed for patient. She will follow up in  3 months for surveillance.  She was told to call with any further questions, or with any concerns about this method of contraception.  Emphasized use of condoms 100% of the time for STI prevention.  Emergency Contraception: n/a - continuous use of depo  1. Family planning services -Rx depo x1 yr. Counseling as above.  -Pap due in 1 yr -CBE done today. "Active FYIs" info up to date. Recommended screening mammograms beginning at age 29 -A1c: pt declines -STI: declines screening -Recommended PCP  f/u for HAs, PCP handout given - medroxyPROGESTERone (DEPO-PROVERA) injection 150 mg  2. Hypertension, unspecified type -BP elevated today, pt states she is taking medication as prescribed. Advised asap f/u with prescribing provider to be sure dose is adequate.     Return in about 3 months (around 03/17/2020) for Depo.  Future Appointments  Date Time Provider [place]  12/17/2019  4:00 PM Jenkins Rouge, [place] [facility] None  12/23/2019  3:00 PM Jenkins Rouge, LCAS [facility] None  01/06/2020  4:00 PM Noah Delaine, [place] S, LCAS [facility] None    Kandee Keen, PA-C

## 2019-12-17 ENCOUNTER — Encounter (HOSPITAL_COMMUNITY): Payer: Self-pay | Admitting: Licensed Clinical Social Worker

## 2019-12-17 ENCOUNTER — Ambulatory Visit (INDEPENDENT_AMBULATORY_CARE_PROVIDER_SITE_OTHER): Payer: No Typology Code available for payment source | Admitting: Licensed Clinical Social Worker

## 2019-12-17 DIAGNOSIS — F4310 Post-traumatic stress disorder, unspecified: Secondary | ICD-10-CM

## 2019-12-17 DIAGNOSIS — F1010 Alcohol abuse, uncomplicated: Secondary | ICD-10-CM | POA: Diagnosis not present

## 2019-12-17 DIAGNOSIS — F331 Major depressive disorder, recurrent, moderate: Secondary | ICD-10-CM

## 2019-12-17 DIAGNOSIS — F411 Generalized anxiety disorder: Secondary | ICD-10-CM | POA: Diagnosis not present

## 2019-12-17 DIAGNOSIS — F4321 Adjustment disorder with depressed mood: Secondary | ICD-10-CM

## 2019-12-17 NOTE — Progress Notes (Signed)
Virtual Visit via Video Note  I connected with Dawn Waller on 12/17/19 at  4:00 PM EDT by a video enabled telemedicine application and verified that I am speaking with the correct person using two identifiers.   I discussed the limitations of evaluation and management by telemedicine and the availability of in person appointments. The patient expressed understanding and agreed to proceed.  History of Present Illness:  Patient is referred by [facility] for Depression, Anxiety, Alcohol Use Disorder, PTSD, Grief    Observations/Objective: Patient presented anxious for her virtual initial counseling session. Spent a considerable amount of time building a trusting, therapeutic relationship. Patient sees a psychiatrist at the [place], who prescribes no medication. Her PCP at the [place] prescribes Zoloft. Discussed with patient the benefit of having psychiatry and therapy through community care. Patient also sees a therapist at [facility] for her alcohol use disorder, who prescribed Campral for her alcohol use.Patient also has been diagnosed with fatty liver. Discussed the therapy process with patient.   PLAN:Cln emailed patient mindfulness activities, grounding and relaxation techniques, grief workbook. To begin discussion on these at next session.    Assessment and Plan: Counselor will continue to meet with patient to address treatment plan goals. Patient will continue to follow recommendations of providers and implement skills learned in session.   Follow Up Instructions:    I discussed the assessment and treatment plan with the patient. The patient was provided an opportunity to ask questions and all were answered. The patient agreed with the plan and demonstrated an understanding of the instructions.   The patient was advised to call back or seek an in-person evaluation if the symptoms worsen or if the condition fails to improve as anticipated.  I provided 40 minutes of non-face-to-face time  during this encounter.   Mairen Wallenstein S, LCAS

## 2019-12-23 ENCOUNTER — Ambulatory Visit (INDEPENDENT_AMBULATORY_CARE_PROVIDER_SITE_OTHER): Payer: No Typology Code available for payment source | Admitting: Licensed Clinical Social Worker

## 2019-12-23 ENCOUNTER — Other Ambulatory Visit: Payer: Self-pay

## 2019-12-23 DIAGNOSIS — F4321 Adjustment disorder with depressed mood: Secondary | ICD-10-CM

## 2019-12-23 DIAGNOSIS — F4310 Post-traumatic stress disorder, unspecified: Secondary | ICD-10-CM | POA: Diagnosis not present

## 2019-12-23 DIAGNOSIS — F331 Major depressive disorder, recurrent, moderate: Secondary | ICD-10-CM

## 2019-12-23 DIAGNOSIS — F411 Generalized anxiety disorder: Secondary | ICD-10-CM

## 2019-12-23 DIAGNOSIS — F1010 Alcohol abuse, uncomplicated: Secondary | ICD-10-CM | POA: Diagnosis not present

## 2019-12-25 ENCOUNTER — Encounter (HOSPITAL_COMMUNITY): Payer: Self-pay | Admitting: Licensed Clinical Social Worker
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
## 2020-01-06 ENCOUNTER — Other Ambulatory Visit: Payer: Self-pay

## 2020-01-06 ENCOUNTER — Ambulatory Visit (INDEPENDENT_AMBULATORY_CARE_PROVIDER_SITE_OTHER): Payer: No Typology Code available for payment source | Admitting: Licensed Clinical Social Worker

## 2020-01-06 ENCOUNTER — Encounter (HOSPITAL_COMMUNITY): Payer: Self-pay | Admitting: Licensed Clinical Social Worker

## 2020-01-06 DIAGNOSIS — F4310 Post-traumatic stress disorder, unspecified: Secondary | ICD-10-CM | POA: Diagnosis not present

## 2020-01-06 DIAGNOSIS — F411 Generalized anxiety disorder: Secondary | ICD-10-CM

## 2020-01-06 DIAGNOSIS — F1010 Alcohol abuse, uncomplicated: Secondary | ICD-10-CM | POA: Diagnosis not present

## 2020-01-06 DIAGNOSIS — F4321 Adjustment disorder with depressed mood: Secondary | ICD-10-CM

## 2020-01-06 DIAGNOSIS — F331 Major depressive disorder, recurrent, moderate: Secondary | ICD-10-CM | POA: Diagnosis not present

## 2020-01-06 NOTE — Progress Notes (Signed)
Virtual Visit via Video Note  I connected with Dawn Waller on 01/06/20 at  4:00 PM EDT by a video enabled telemedicine application and verified that I am speaking with the correct person using two identifiers.   I discussed the limitations of evaluation and management by telemedicine and the availability of in person appointments. The patient expressed understanding and agreed to proceed.  History of Present Illness:  Patient is referred by [facility] for Depression, Anxiety, Alcohol Use Disorder, PTSD, Grief  LOCATION: Patient: Home Provider: Home   Observations/Objective: Patient presented depressed for her virtual initial counseling session. Patient described her psychiatric symptoms and current life events. Patient had trouble with her caregility so it took awhile for her to join today. Patient reports on her continued daily alcohol use. She continues to use Campral but nothing has changed with her cravings and use. Remind patient to check with her provider at [facility] to get [facility] Also suggested that vivatrol or naltrexone may be a better medication for patient's continued alochol cravings and use. Patient provided reasons she drinks: deal with her grief, depression and anxiety. Cln provided psychoeducation on alternatives to using alcohol as her coping skill. Cln also provided education on alcohol being a depressant and how it affects her moods. Patient reports she went to the beach over the holiday weekend with family. Used socratic questions where patient reports she was able to "wind down" but did drink.       PLAN: cut back on alcohol use, get referral for [facility], journal alcohol intake daily, medication?  mindfulness activities, grounding and relaxation techniques, grief workbook.    Assessment and Plan: Counselor will continue to meet with patient to address treatment plan goals. Patient  will continue to follow recommendations of providers and implement skills learned in session.   Follow Up Instructions: I discussed the assessment and treatment plan with the patient. The patient was provided an opportunity to ask questions and all were answered. The patient agreed with the plan and demonstrated an understanding of the instructions.   The patient was advised to call back or seek an in-person evaluation if the symptoms worsen or if the condition fails to improve as anticipated.  I provided 45 minutes of non-face-to-face time during this encounter.   Ryah Cribb S, LCAS

## 2020-01-13 ENCOUNTER — Telehealth (HOSPITAL_COMMUNITY): Payer: Self-pay | Admitting: Licensed Clinical Social Worker

## 2020-01-13 ENCOUNTER — Ambulatory Visit (HOSPITAL_COMMUNITY): Payer: No Typology Code available for payment source | Admitting: Licensed Clinical Social Worker

## 2020-01-13 ENCOUNTER — Other Ambulatory Visit: Payer: Self-pay

## 2020-01-13 NOTE — Telephone Encounter (Signed)
Pt did not present for her virtual counseling session. Called patient and left vm for her to join the session. She did not join. Roylee Chaffin, LCAS

## 2020-01-20 ENCOUNTER — Telehealth (HOSPITAL_COMMUNITY): Payer: Self-pay | Admitting: Licensed Clinical Social Worker

## 2020-01-20 ENCOUNTER — Encounter (HOSPITAL_COMMUNITY): Payer: Self-pay | Admitting: Licensed Clinical Social Worker

## 2020-01-20 ENCOUNTER — Other Ambulatory Visit: Payer: Self-pay

## 2020-01-20 ENCOUNTER — Ambulatory Visit (INDEPENDENT_AMBULATORY_CARE_PROVIDER_SITE_OTHER): Payer: No Typology Code available for payment source | Admitting: Licensed Clinical Social Worker

## 2020-01-20 DIAGNOSIS — F331 Major depressive disorder, recurrent, moderate: Secondary | ICD-10-CM | POA: Diagnosis not present

## 2020-01-20 DIAGNOSIS — F4321 Adjustment disorder with depressed mood: Secondary | ICD-10-CM

## 2020-01-20 DIAGNOSIS — F4310 Post-traumatic stress disorder, unspecified: Secondary | ICD-10-CM

## 2020-01-20 DIAGNOSIS — F1010 Alcohol abuse, uncomplicated: Secondary | ICD-10-CM | POA: Diagnosis not present

## 2020-01-20 DIAGNOSIS — F411 Generalized anxiety disorder: Secondary | ICD-10-CM | POA: Insufficient documentation
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
## 2020-01-20 NOTE — Telephone Encounter (Signed)
Pt did not present for her virtual counseling session. Called and left vm msg to join session. She did not join. Thelda Gagan Noah Delaine, LCAS

## 2020-01-28 ENCOUNTER — Other Ambulatory Visit: Payer: Self-pay

## 2020-01-28 ENCOUNTER — Encounter (HOSPITAL_COMMUNITY): Payer: Self-pay | Admitting: Licensed Clinical Social Worker

## 2020-01-28 ENCOUNTER — Ambulatory Visit (INDEPENDENT_AMBULATORY_CARE_PROVIDER_SITE_OTHER): Payer: No Typology Code available for payment source | Admitting: Licensed Clinical Social Worker

## 2020-01-28 DIAGNOSIS — F331 Major depressive disorder, recurrent, moderate: Secondary | ICD-10-CM | POA: Diagnosis not present

## 2020-01-28 DIAGNOSIS — F4321 Adjustment disorder with depressed mood: Secondary | ICD-10-CM

## 2020-01-28 DIAGNOSIS — F4310 Post-traumatic stress disorder, unspecified: Secondary | ICD-10-CM | POA: Diagnosis not present

## 2020-01-28 DIAGNOSIS — F1010 Alcohol abuse, uncomplicated: Secondary | ICD-10-CM | POA: Diagnosis not present

## 2020-01-28 DIAGNOSIS — F411 Generalized anxiety disorder: Secondary | ICD-10-CM
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
## 2020-02-03 ENCOUNTER — Ambulatory Visit (HOSPITAL_COMMUNITY): Payer: No Typology Code available for payment source | Admitting: Licensed Clinical Social Worker

## 2020-02-03 ENCOUNTER — Telehealth (HOSPITAL_COMMUNITY): Payer: Self-pay | Admitting: Licensed Clinical Social Worker

## 2020-02-03 ENCOUNTER — Other Ambulatory Visit: Payer: Self-pay

## 2020-02-03 NOTE — Telephone Encounter (Signed)
Pt did not present for her individual virtual counseling session. Called and left a msg on her vm asking her to join. She did not join.  Ryen Rhames, LCAS

## 2020-02-24 ENCOUNTER — Ambulatory Visit (HOSPITAL_COMMUNITY): Payer: No Typology Code available for payment source | Admitting: Licensed Clinical Social Worker

## 2020-02-24 ENCOUNTER — Other Ambulatory Visit: Payer: Self-pay

## 2020-03-05 ENCOUNTER — Ambulatory Visit (LOCAL_COMMUNITY_HEALTH_CENTER): Payer: No Typology Code available for payment source

## 2020-03-05 ENCOUNTER — Other Ambulatory Visit: Payer: Self-pay

## 2020-03-05 VITALS — BP 138/89 | Ht 65.0 in | Wt 204.0 lb

## 2020-03-05 DIAGNOSIS — Z3009 Encounter for other general counseling and advice on contraception: Secondary | ICD-10-CM

## 2020-03-05 DIAGNOSIS — Z30013 Encounter for initial prescription of injectable contraceptive: Secondary | ICD-10-CM

## 2020-03-05 DIAGNOSIS — Z3042 Encounter for surveillance of injectable contraceptive: Secondary | ICD-10-CM

## 2020-03-05 NOTE — Progress Notes (Signed)
DMPA 150mg  given IM today per 12/16/2019 order by Charlotte Sanes, PA and tolerated well.  She is 11w 3d post last Depo.

## 2020-04-15 ENCOUNTER — Telehealth (HOSPITAL_COMMUNITY): Payer: Self-pay | Admitting: Licensed Clinical Social Worker

## 2020-04-27 ENCOUNTER — Other Ambulatory Visit: Payer: Self-pay

## 2020-04-27 ENCOUNTER — Ambulatory Visit (INDEPENDENT_AMBULATORY_CARE_PROVIDER_SITE_OTHER): Payer: No Typology Code available for payment source | Admitting: Licensed Clinical Social Worker

## 2020-04-27 DIAGNOSIS — F1020 Alcohol dependence, uncomplicated: Secondary | ICD-10-CM

## 2020-04-27 DIAGNOSIS — F411 Generalized anxiety disorder: Secondary | ICD-10-CM

## 2020-04-27 DIAGNOSIS — F331 Major depressive disorder, recurrent, moderate: Secondary | ICD-10-CM | POA: Diagnosis not present

## 2020-05-03 ENCOUNTER — Other Ambulatory Visit: Payer: Self-pay

## 2020-05-03 ENCOUNTER — Ambulatory Visit (HOSPITAL_COMMUNITY): Payer: No Typology Code available for payment source | Admitting: Licensed Clinical Social Worker

## 2020-05-04 ENCOUNTER — Telehealth (HOSPITAL_COMMUNITY): Payer: Self-pay | Admitting: Licensed Clinical Social Worker

## 2020-05-10 ENCOUNTER — Encounter (HOSPITAL_COMMUNITY): Payer: Self-pay | Admitting: Licensed Clinical Social Worker
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
## 2020-05-13 ENCOUNTER — Ambulatory Visit (INDEPENDENT_AMBULATORY_CARE_PROVIDER_SITE_OTHER): Payer: Self-pay | Admitting: Licensed Clinical Social Worker

## 2020-05-13 ENCOUNTER — Encounter: Payer: Self-pay | Admitting: Licensed Clinical Social Worker

## 2020-05-13 ENCOUNTER — Other Ambulatory Visit: Payer: Self-pay

## 2020-05-13 DIAGNOSIS — F331 Major depressive disorder, recurrent, moderate: Secondary | ICD-10-CM

## 2020-05-13 DIAGNOSIS — F1020 Alcohol dependence, uncomplicated: Secondary | ICD-10-CM

## 2020-05-14 NOTE — Progress Notes (Signed)
Patient Location: Work Cytogeneticist Visit via Video Note  I connected with [employer] on 05/13/20 at  3:00 PM EDT by a video enabled telemedicine application and verified that I am speaking with the correct person using two identifiers.   I discussed the limitations of evaluation and management by telemedicine and the availability of in person appointments. The patient expressed understanding and agreed to proceed.  Comprehensive Clinical Assessment (CCA) Note  05/13/20 Dawn Waller [id number]  Visit Diagnosis:      ICD-10-CM   1. MDD (major depressive disorder), recurrent episode, moderate (HCC)  F33.1   2. Alcohol use disorder, moderate, dependence ([place])  F10.20       CCA Screening, Triage and Referral (STR) STR has been completed on paper by the patient/patient's guardian.  (See scanned document in Chart Review)  CCA Biopsychosocial  Intake/Chief Complaint:  CCA Intake With Chief Complaint CCA Part Two Date: 05/13/20 CCA Part Two Time: 32 Chief Complaint/Presenting Problem: Pt presents as a 44 year old African-American veteran female for assessment. Pt was initially referred to therapist in [place] by [facility] back in May of 2021 and was seeking counseling for depression related to the death of her father. Pt reported she met with previous therapist a few times and was completing assignments in grief workbook. Pt has been drinking alcohol daily since father's passing and was recommended to start CD-IOP on 04/27/20 by previous therapist. Pt reported due to her work schedule she was not able to engage in IOP and was referred on to this therapist. Most of this information was obtained by reviewing patient's chart as she was quite tearful and spoke very little throughout the assessment. Patient's Currently Reported Symptoms/Problems: grief/loss, alcohol Individual's Strengths: Pt reported "I am happy in my job. I like where I  work and what I do". Individual's Preferences: None reported Individual's Abilities: Pt is working full-time and cooperating with DA on a case related to her father's death. Type of Services Patient Feels Are Needed: Individual Therapy and Medication Management Initial Clinical Notes/Concerns: Pt was recommended for CDIOP to address alcohol use, however patient reported she is unable to make the time commitment.  Mental Health Symptoms Depression:  Depression: Change in energy/activity, Difficulty Concentrating, Fatigue, Worthlessness, Increase/decrease in appetite, Irritability, Tearfulness, Sleep (too much or little), Duration of symptoms greater than two weeks  Mania:  Mania: None  Anxiety:   Anxiety: Difficulty concentrating, Irritability, Restlessness, Sleep, Tension, Worrying  Psychosis:  Psychosis: Hallucinations (Pt reported "yes" and last time was about 2-3 weeks ago. "I am taking medication for it through the [place]". Pt did not elaborate.)  Trauma:  Trauma: Avoids reminders of event, Emotional numbing, Hypervigilance, Irritability/anger, Difficulty staying/falling asleep, Detachment from others  Obsessions:  Obsessions: Attempts to suppress/neutralize, Disrupts routine/functioning, Intrusive/time consuming  Compulsions:  Compulsions: Disrupts with routine/functioning, Intrusive/time consuming (burning sage and hair pulling)  Inattention:  Inattention: Forgetful, Loses things  Hyperactivity/Impulsivity:  Hyperactivity/Impulsivity: Fidgets with hands/feet  Oppositional/Defiant Behaviors:  Oppositional/Defiant Behaviors: N/A  Emotional Irregularity:  Emotional Irregularity: N/A  Other Mood/Personality Symptoms:  Other Mood/Personality Symptoms: Pt denied current or hx of SI.   Mental Status Exam Appearance and self-care  Stature:  Stature: Average  Weight:  Weight: Average weight  Clothing:  Clothing: Casual  Grooming:  Grooming: Normal  Cosmetic use:  Cosmetic Use: None  Posture/gait:   Posture/Gait: Normal  Motor activity:  Motor Activity: Not Remarkable  Sensorium  Attention:  Attention: Normal  Concentration:  Concentration: Anxiety interferes  Orientation:  Orientation: X5  Recall/memory:  Recall/Memory: Normal  Affect and Mood  Affect:  Affect: Anxious, Depressed, Tearful  Mood:  Mood: Anxious, Depressed  Relating  Eye contact:  Eye Contact: Normal  Facial expression:  Facial Expression: Depressed, Sad  Attitude toward examiner:  Attitude Toward Examiner: Cooperative, Guarded  Thought and Language  Speech flow: Speech Flow: Paucity, Soft  Thought content:     Preoccupation:  Preoccupations: Ruminations  Hallucinations:  Hallucinations: Other (Comment) (Pt reported "yes" and last time was about 2-3 weeks ago. "I am taking medication for it through the [place]". Pt did not elaborate.)  Organization:     Transport planner of Knowledge:  Fund of Knowledge: Average  Intelligence:  Intelligence: Average  Abstraction:  Abstraction: Normal  Judgement:  Judgement: Impaired  Reality Testing:  Reality Testing: Adequate  Insight:  Insight: Fair, Gaps  Decision Making:  Decision Making: Impulsive  Social Functioning  Social Maturity:  Social Maturity: Isolates  Social Judgement:  Social Judgement: Normal  Stress  Stressors:  Stressors: Grief/losses, Illness  Coping Ability:  Coping Ability: Deficient supports  Skill Deficits:  Skill Deficits:  (coping)  Supports:  Supports: Friends/Service system, Family, Church     Religion: Religion/Spirituality Are You A Religious Person?: Yes  Leisure/Recreation: Leisure / Recreation Do You Have Hobbies?: No  Exercise/Diet: Exercise/Diet Do You Exercise?: No Have You Gained or Lost A Significant Amount of Weight in the Past Six Months?: No Do You Follow a Special Diet?: No Do You Have Any Trouble Sleeping?: Yes   CCA Employment/Education  Employment/Work Situation: Employment / Work Situation Employment  situation: Employed Where is patient currently employed?: 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 Per CCA 12/10/19: [employer] How long has patient been employed?: Per CCA 12/10/19: 7 years Patient's job has been impacted by current illness: No What is the longest time patient has a held a job?: [employer] Where was the patient employed at that time?: 9 years Has patient ever been in the [employer]?: Yes (Describe in comment) (I did a year in [place].)  Education: Education Is Patient Currently Attending School?: No (Pt reported "I want to go back to school, but need help before I can try:) Did You Graduate From [employer]?: Yes Did [employer]?: No Did You Attend Graduate School?: No Did You Have An Individualized Education Program (IIEP): No Did You Have Any Difficulty At School?: No   CCA Family/Childhood History  Family and Relationship History: Family history Marital status: Single Are you sexually active?: Yes Does patient have children?: No  Childhood History:  Childhood History Additional childhood history information: Per CCA 12/10/19: I was raised by a woman I called my grandmother no relation by blood. My mother left me with her when i was 24 months old. Description of patient's relationship with caregiver when they were a child: Per CCA 12/10/19: good she was like my mom How were you disciplined when you got in trouble as a child/adolescent?: Per CCA 12/10/19: stern talking to Does patient have siblings?: No (not by blood) Did patient suffer any verbal/emotional/physical/sexual abuse as a child?: No Did patient suffer from severe childhood neglect?: No Has patient ever been sexually abused/assaulted/raped as an adolescent or adult?: No Was the patient ever a victim of a crime or a disaster?: No Witnessed domestic violence?: No Has patient been affected by domestic violence as an adult?: No  Child/Adolescent Assessment:  CCA Substance  Use  Alcohol/Drug Use: Alcohol / Drug Use Pain Medications: SEE MAR Prescriptions: SEE MAR Over the Counter: SEE MAR History of alcohol / drug use?: Yes Longest period of sobriety (when/how long): N/A Negative Consequences of Use:  (Pt denied) Withdrawal Symptoms:  (Pt denied) Substance #1 Name of Substance 1: alcohol 1 - Age of First Use: unknown 1 - Amount (size/oz): more than 1 drink per night usually 1 - Frequency: daily 1 - Duration: since losing her father last year 1 - Last Use / Amount: Last night - I know I had a beer and a glass of wine                       ASAM's:  Six Dimensions of Multidimensional Assessment  Dimension 1:  Acute Intoxication and/or Withdrawal Potential:   Dimension 1:  Description of individual's past and current experiences of substance use and withdrawal: Pt drinking daily since father's passing last year. Pt reported she is still able to work full-time job and drinks at least 1 or more beers/glasses of wine nightly after work. Pt denied negative consequences or withdrawals.  Dimension 2:  Biomedical Conditions and Complications:      Dimension 3:  Emotional, Behavioral, or Cognitive Conditions and Complications:     Dimension 4:  Readiness to Change:     Dimension 5:  Relapse, Continued use, or Continued Problem Potential:     Dimension 6:  Recovery/Living Environment:     ASAM Severity Score: ASAM's Severity Rating Score: 8  ASAM Recommended Level of Treatment: ASAM Recommended Level of Treatment: Level I Outpatient Treatment (Pt recommended for CDIOP by previous therapist, however refused. Therapist will address in sessions and continue to monitor.)   Substance use Disorder (SUD) Substance Use Disorder (SUD)  Checklist Symptoms of Substance Use: Presence of craving or strong urge to use, Persistent desire or unsuccessful efforts to cut down or control use, Social, occupational, recreational activities given up or reduced due to  use  Recommendations for Services/Supports/Treatments: Recommendations for Services/Supports/Treatments Recommendations For Services/Supports/Treatments: Individual Therapy  DSM5 Diagnoses: Patient Active Problem List   Diagnosis Date Noted  . MDD (major depressive disorder), recurrent episode, moderate ([place]) 01/20/2020  . GAD (generalized anxiety disorder) 01/20/2020  . Alcohol use disorder, mild, abuse 01/20/2020  . Hypertension 12/16/2019  . Cervical high risk HPV (human papillomavirus) test positive 11/06/2019  . Fibroids 09/12/2017  . Adjustment disorder with mixed anxiety and depressed mood 03/02/2015  . Posttraumatic stress disorder 04/30/2014    Patient Centered Plan: Patient is on the following Treatment Plan(s):  Depression and Substance Abuse   Follow Up Instructions:    I discussed the assessment and treatment plan with the patient. The patient was provided an opportunity to ask questions and all were answered. The patient agreed with the plan and demonstrated an understanding of the instructions.   The patient was advised to call back or seek an in-person evaluation if the symptoms worsen or if the condition fails to improve as anticipated.  I provided 30 minutes of non-face-to-face time during this encounter.   Larz Mark Wynelle Link, LCSW, LCAS

## 2020-05-16 NOTE — Telephone Encounter (Signed)
Unable to reach for CDIOP contact.

## 2020-05-19 ENCOUNTER — Ambulatory Visit: Payer: Self-pay

## 2020-05-28 ENCOUNTER — Other Ambulatory Visit: Payer: Self-pay

## 2020-05-28 ENCOUNTER — Ambulatory Visit (LOCAL_COMMUNITY_HEALTH_CENTER): Payer: Self-pay

## 2020-05-28 VITALS — BP 140/76 | Ht 65.0 in | Wt 208.0 lb

## 2020-05-28 DIAGNOSIS — Z3042 Encounter for surveillance of injectable contraceptive: Secondary | ICD-10-CM

## 2020-05-28 DIAGNOSIS — Z3009 Encounter for other general counseling and advice on contraception: Secondary | ICD-10-CM

## 2020-05-28 NOTE — Progress Notes (Signed)
Depo given ([place]) per Earnestine Mealing PA 12/16/19 order; tolerated well .Aileen Fass, RN

## 2020-05-31 ENCOUNTER — Ambulatory Visit: Payer: Self-pay | Admitting: Licensed Clinical Social Worker

## 2020-06-09 ENCOUNTER — Encounter: Payer: Self-pay | Admitting: Psychiatry

## 2020-06-09 ENCOUNTER — Telehealth (HOSPITAL_BASED_OUTPATIENT_CLINIC_OR_DEPARTMENT_OTHER): Payer: No Typology Code available for payment source | Admitting: Psychiatry

## 2020-06-09 ENCOUNTER — Other Ambulatory Visit: Payer: Self-pay

## 2020-06-09 DIAGNOSIS — F1020 Alcohol dependence, uncomplicated: Secondary | ICD-10-CM | POA: Insufficient documentation

## 2020-06-09 DIAGNOSIS — F4312 Post-traumatic stress disorder, chronic: Secondary | ICD-10-CM

## 2020-06-09 DIAGNOSIS — F411 Generalized anxiety disorder: Secondary | ICD-10-CM

## 2020-06-09 DIAGNOSIS — F633 Trichotillomania: Secondary | ICD-10-CM | POA: Insufficient documentation

## 2020-06-09 DIAGNOSIS — F331 Major depressive disorder, recurrent, moderate: Secondary | ICD-10-CM | POA: Diagnosis not present

## 2020-06-09 DIAGNOSIS — Z9189 Other specified personal risk factors, not elsewhere classified: Secondary | ICD-10-CM | POA: Insufficient documentation

## 2020-06-09 MED ORDER — HYDROXYZINE HCL 25 MG PO TABS: 12.5000 mg | ORAL_TABLET | Freq: Every day | ORAL | 1 refills | Status: DC | PRN

## 2020-06-09 MED ORDER — PAROXETINE HCL ER 12.5 MG PO TB24: 12.5000 mg | ORAL_TABLET | Freq: Every day | ORAL | 1 refills | Status: DC

## 2020-06-09 MED ORDER — SERTRALINE HCL 100 MG PO TABS: 50.0000 mg | ORAL_TABLET | Freq: Every day | ORAL | 0 refills | Status: DC

## 2020-06-09 NOTE — Progress Notes (Signed)
Virtual Visit via Video Note  I connected with Cierrah L Swider on 06/09/20 at  9:00 AM EDT by a video enabled telemedicine application and verified that I am speaking with the correct person using two identifiers.  Location Provider Location : [facility] Patient Location : Home  Participants: Patient , Provider   I discussed the limitations of evaluation and management by telemedicine and the availability of in person appointments. The patient expressed understanding and agreed to proceed.    I discussed the assessment and treatment plan with the patient. The patient was provided an opportunity to ask questions and all were answered. The patient agreed with the plan and demonstrated an understanding of the instructions.   The patient was advised to call back or seek an in-person evaluation if the symptoms worsen or if the condition fails to improve as anticipated.    Psychiatric Initial Adult Assessment   Patient Identification: FOLASHADE GAMBOA MRN:  [id number] Date of Evaluation:  06/09/2020 Referral Source: Lacie Scotts MD Chief Complaint:   Chief Complaint    Follow-up     Visit Diagnosis:    ICD-10-CM   1. MDD (major depressive disorder), recurrent episode, moderate (HCC)  F33.1 sertraline (ZOLOFT) 100 MG tablet    PARoxetine (PAXIL CR) 12.5 MG 24 hr tablet    hydrOXYzine (ATARAX/VISTARIL) 25 MG tablet  2. 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 Chronic post-traumatic stress disorder (PTSD)  F43.12 PARoxetine (PAXIL CR) 12.5 MG 24 hr tablet  5. Alcohol use disorder, moderate, dependence (HCC)  F10.20   6. At risk for prolonged QT interval syndrome  Z91.89 EKG 12-Lead    History of  Present Illness:  RISHITA PETRON is a 44 year old African-American female, employed, single, lives in [place] with her boyfriend, has a history of depression, anxiety, PTSD, alcohol use disorder, obstructive sleep apnea noncompliant on CPAP, hypertension, gastroesophageal reflux disease, low back pain, fatty liver, seasonal allergies, was evaluated by telemedicine today.  Patient is currently under the care of our therapist Ms. Zadie Rhine who is providing her psychotherapy.  Patient prior to that was under the care of our therapist at [facility]-Ms. Vale Haven McKenzie was treating her for mental health problems.  Patient was referred for CD IOP however did not engage and hence was referred for individual therapy to Ms. Zadie Rhine at our practice.  Patient today reports she has been struggling with depression, anxiety as well as sleep problems since the past several years.  She reports she was diagnosed with PTSD and depression sometime around 2015 after she had to spend some time at [place].  She however could not elaborate much about her PTSD symptoms and reports she blocks it all out.  Patient however reports since the death of her father in a motor medical accident in December 2020 her mood symptoms has been getting worse.  She describes her depressive symptoms as sadness, crying spells, lack of motivation, concentration problems, sleep problems, anhedonia.  This has been getting worse since the past several months.  She reports the Zoloft that she takes, which she has been taking since the past several years could be helping to some extent.  Patient denies any suicidality,  homicidality.  Patient does report a remote history of auditory hallucinations however could not elaborate much.  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  She worries about everything to the extreme.  She is often restless, fidgety, anxious and has anxiety attacks.  This has been getting worse since the past few  months.  She does not know if the Zoloft is helpful for the same.  Patient does report sleep problems.  She does not follow good sleep hygiene.  She however reports she sleeps only 3 to 4 hours at night.  She has difficulty falling asleep and maintaining sleep.  She currently takes melatonin 10 mg which does not help.  She is also on doxepin 25 mg which was started a month ago by her primary care provider.  She does not know if this is helpful.  She does have a history of obstructive sleep apnea.  She reports her CPAP was on recall and hence currently does not use it.  She does report that she has been struggling with pulling out her hair since the past several months.  This is currently getting worse.  During the session with writer today patient was observed as very restless, fidgety and was constantly picking the back of her head.  Patient reports she was never diagnosed with trichotillomania in the past.  Patient denies any manic or hypomanic symptoms.  Patient does report history of alcoholism.  She reports her alcoholism started getting worse 9 to 10 months ago after her father passed away in 08-07-23.  She reports she drank last night and drank 2 glasses of wine and a glass of mixed drink.  She reports she drinks every night however could not elaborate on how much she drinks.  She denies any blackouts.  She denies withdrawal symptoms.  She is currently trying to cut back on her drinking.  She is working with her therapist.  Associated Signs/Symptoms: Depression Symptoms:  depressed mood, anhedonia, insomnia, psychomotor agitation, difficulty concentrating, anxiety, (Hypo) Manic Symptoms:  Denies Anxiety Symptoms:  Excessive Worry, Psychotic Symptoms:  Does report some AH - in the past, does not elaborate PTSD Symptoms: Had a traumatic exposure:  as noted above  Past Psychiatric History: Patient was under the care of her PCP.  She was also on the care of counselor Ms. Lisbeth McKenzie in  [place] recently and was referred to Ms. Zadie Rhine at our practice for individual therapy.  Patient denies inpatient mental health admissions.  Patient denies suicide attempts.  Previous Psychotropic Medications: Yes Past trials of medications like Zoloft, doxepin, melatonin  Substance Abuse History in the last 12 months:  Yes.  Alcoholism-drinks every day 2 to 3 glasses of wine.  Denies blackouts, withdrawal symptoms.  This has been getting worse since the past 9 months.  Denies inpatient treatment for alcoholism. Consequences of Substance Abuse: Medical Consequences:  mood problems, sleep problems  Past Medical History:  Past Medical History:  Diagnosis Date  . Back pain   . Chronic headaches   . Depression   . Fatty liver   . GERD (gastroesophageal reflux disease)   . Hypertension   . Migraine   . OSA on CPAP   . Seasonal allergies     Past Surgical History:  Procedure Laterality Date  . CHOLECYSTECTOMY     October 08, 2018    Family Psychiatric History: Patient reports he does not know much about his family and does not know if there is a history of mental health  problems.  Family History:  Family History  Problem Relation Age of Onset  . Healthy Mother   . Healthy Father   . Breast cancer Neg Hx     Social History:   Social History   Socioeconomic History  . Marital status: Single    Spouse name: Not on file  . Number of children: Not on file  . Years of education: Not on file  . Highest education level: Not on file  Occupational History  . Not on file  Tobacco Use  . Smoking status: Never Smoker  . Smokeless tobacco: Never Used  Vaping Use  . Vaping Use: Never used  Substance and Sexual Activity  . Alcohol use: Yes    Alcohol/week: 7.0 standard drinks    Types: 7 Glasses of wine per week    Comment: wine or vodka mixed with soda  . Drug use: Never  . Sexual activity: Not Currently    Partners: Male    Birth control/protection: Injection  Other  Topics Concern  . Not on file  Social History Narrative  . Not on file   Social Determinants of Health   Financial Resource Strain:   . Difficulty of Paying Living Expenses: Not on file  Food Insecurity:   . Worried About Charity fundraiser in the Last Year: Not on file  . Ran Out of Food in the Last Year: Not on file  Transportation Needs:   . Lack of Transportation (Medical): Not on file  . Lack of Transportation (Non-Medical): Not on file  Physical Activity:   . Days of Exercise per Week: Not on file  . Minutes of Exercise per Session: Not on file  Stress:   . Feeling of Stress : Not on file  Social Connections:   . Frequency of Communication with Friends and Family: Not on file  . Frequency of Social Gatherings with Friends and Family: Not on file  . Attends Religious Services: Not on file  . Active Member of Clubs or Organizations: Not on file  . Attends Archivist Meetings: Not on file  . Marital Status: Not on file    Additional Social History: Patient reports her mother abandoned her when she was around 41 months old.  She hence was raised in foster care.  She reports she had an okay childhood.  She reports she and her dad started talking to each other around 4 years ago.  He however passed away 9 months ago and at motor vehicle accident.  Patient currently lives with her boyfriend in [place].  She works as a Research scientist (physical sciences).  She reports she is in the [employer] and spent an year in [place] in 2015.  Patient denies having children.  Does report a history of trauma summarized above.  Allergies:   Allergies  Allergen Reactions  . Sumatriptan   . Elavil [Amitriptyline]     Metabolic Disorder Labs: No results found for: HGBA1C, MPG No results found for: PROLACTIN No results found for: CHOL, TRIG, HDL, CHOLHDL, VLDL, LDLCALC No results found for: TSH  Therapeutic Level Labs: No results found for: LITHIUM No results found for: CBMZ No results found for:  VALPROATE  Current Medications: Current Outpatient Medications  Medication Sig Dispense Refill  . albuterol (PROVENTIL HFA;VENTOLIN HFA) 108 (90 Base) MCG/ACT inhaler Inhale into the lungs.    . budesonide-formoterol (SYMBICORT) 160-4.5 MCG/ACT inhaler Inhale into the lungs.    . cetirizine (ZYRTEC) 10 MG tablet Take by mouth.    Marland Kitchen  cholecalciferol (VITAMIN D3) 10 MCG (400 UNIT) TABS tablet Take 1,000 Units by mouth.    . cyclobenzaprine (FLEXERIL) 10 MG tablet Take 10 mg by mouth 3 (three) times daily as needed for muscle spasms.    Marland Kitchen doxepin (SINEQUAN) 25 MG capsule Take 25 mg by mouth.    . esomeprazole (NEXIUM) 20 MG capsule Take 20 mg by mouth daily at 12 noon.    . famotidine (PEPCID) 20 MG tablet Take 20 mg by mouth 2 (two) times daily.    . fluticasone (FLONASE) 50 MCG/ACT nasal spray Place into the nose.    . Melatonin 10 MG CAPS Take 10 mg by mouth.    . sertraline (ZOLOFT) 100 MG tablet Take 0.5 tablets (50 mg total) by mouth daily. Stop after a week 7 tablet 0  . verapamil (CALAN) 120 MG tablet Take 120 mg by mouth 2 (two) times daily.    . hydrOXYzine (ATARAX/VISTARIL) 25 MG tablet Take 0.5-1 tablets (12.5-25 mg total) by mouth daily as needed for anxiety. 30 tablet 1  . Multiple Vitamins-Minerals (MULTIVITAMIN WITH MINERALS) tablet Take 1 tablet by mouth daily. (Patient not taking: Reported on 06/09/2020) 100 tablet 0  . PARoxetine (PAXIL CR) 12.5 MG 24 hr tablet Take 1 tablet (12.5 mg total) by mouth daily. 30 tablet 1   Current Facility-Administered Medications  Medication Dose Route Frequency Provider Last Rate Last Admin  . medroxyPROGESTERone (DEPO-PROVERA) injection 150 mg  150 mg Intramuscular Q90 days Staples, Jenna L, PA-C   150 mg at 05/28/20 1700    Musculoskeletal: Strength & Muscle Tone: UTA Gait & Station: UTA Patient leans: N/A  Psychiatric Specialty Exam: Review of Systems  Musculoskeletal: Positive for back pain.  Psychiatric/Behavioral: Positive for  dysphoric mood and sleep disturbance. The patient is nervous/anxious.   All other systems reviewed and are negative.   There were no vitals taken for this visit.There is no height or weight on file to calculate BMI.  General Appearance: Casual  Eye Contact:  Fair  Speech:  Clear and Coherent  Volume:  Normal  Mood:  Anxious and Depressed  Affect:  Congruent  Thought Process:  Goal Directed and Descriptions of Associations: Intact  Orientation:  Full (Time, Place, and Person)  Thought Content:  Rumination  Suicidal Thoughts:  No  Homicidal Thoughts:  No  Memory:  Immediate;   Fair Recent;   Fair Remote;   Fair  Judgement:  Fair  Insight:  Fair  Psychomotor Activity:  Restlessness  Concentration:  Concentration: Fair and Attention Span: Fair  Recall:  [employer] of Knowledge:Fair  Language: Fair  Akathisia:  No  Handed:  Right  AIMS (if indicated):  UTA  Assets:  Communication Skills Desire for Improvement Housing Intimacy Social Support Talents/Skills Transportation Vocational/Educational  ADL's:  Intact  Cognition: WNL  Sleep:  Poor   Screenings: GAD-7     Video Visit from 06/09/2020 in [facility]  Total GAD-7 Score 18    PHQ2-9     Video Visit from 06/09/2020 in [facility] Visit from 12/16/2019 in [facility]  PHQ-2 Total Score 6 6  PHQ-9 Total Score 19 15      Assessment and Plan: DEBRIA BROECKER is a 18 year old African-American female, lives with her boyfriend, employed, has a history of depression, anxiety, alcohol use disorder, multiple medical problems including obstructive sleep apnea noncompliant on CPAP, fatty liver, hypertension, migraine, GERD, was evaluated by telemedicine today.  Patient is  biologically predisposed given her history of trauma and multiple medical problems.  Patient with psychosocial stressors of death of her father will benefit from medication  readjustment and psychotherapy sessions.   Plan MDD-unstable Taper off Zoloft for lack of benefit. Start Paxil extended release 12.5 mg p.o. daily. We'll gradually increase the dosage as needed. Discussed the risk of serotonin syndrome. Continue doxepin 25 mg p.o. nightly as needed Discussed sleep hygiene techniques. Also discussed compliance with CPAP for OSA. PHQ 9 equals 19  GAD-unstable Start Paxil extended release 12.5 mg p.o. daily Continue CBT Start hydroxyzine 12.5 to 25 mg p.o. daily as needed for severe anxiety attacks  Trichotillomania-unstable Patient will benefit from a referral to dermatology.  She will talk to her primary care provider. Start Paxil extended release. Continue CBT  Alcohol use disorder moderate-some progress Provided counseling.  PTSD-chronic We'll monitor closely  Patient will benefit from the following labs-TSH.  Patient will benefit from EKG to monitor QTC.  I have reviewed medical records from notes per therapist Ms. Dorian Furnace as well as Ms. Zadie Rhine.  I have also reviewed notes from [place] Dr. Lacie Scotts -dated 01/16/2020-patient was tried on acamprosate for alcoholism.  Naltrexone was considered however due to elevation of AST/ALT it was held off.  AST-107, ALT-145.   Follow-up in clinic in 3 to 4 weeks or sooner if needed.  I have spent atleast 40 minutes face to face by video with patient today. More than 50 % of the time was spent for preparing to see the patient ( e.g., review of test, records ), obtaining and to review and separately obtained history , ordering medications and test ,psychoeducation and supportive psychotherapy and care coordination,as well as documenting clinical information in electronic health record.This note was generated in part or whole with voice recognition software. Voice recognition is usually quite accurate but there are transcription errors that can and very often do  occur. I apologize for any typographical errors that were not detected and corrected.      Ursula Alert, MD 11/4/202112:56 PM

## 2020-06-09 NOTE — Patient Instructions (Signed)
Hydroxyzine capsules or tablets What is this medicine? HYDROXYZINE (hye [place] i zeen) is an antihistamine. This medicine is used to treat allergy symptoms. It is also used to treat anxiety and tension. This medicine can be used with other medicines to induce sleep before surgery. This medicine may be used for other purposes; ask your health care provider or pharmacist if you have questions. COMMON BRAND NAME(S): ANX, Atarax, Rezine, Vistaril What should I tell my health care provider before I take this medicine? They need to know if you have any of these conditions:  glaucoma  heart disease  history of irregular heartbeat  kidney disease  liver disease  lung or breathing disease, like asthma  stomach or intestine problems  thyroid disease  trouble passing urine  an unusual or allergic reaction to hydroxyzine, cetirizine, other medicines, foods, dyes or preservatives  pregnant or trying to get pregnant  breast-feeding How should I use this medicine? Take this medicine by mouth with a full glass of water. Follow the directions on the prescription label. You may take this medicine with food or on an empty stomach. Take your medicine at regular intervals. Do not take your medicine more often than directed. Talk to your pediatrician regarding the use of this medicine in children. Special care may be needed. While this drug may be prescribed for children as young as 42 years of age for selected conditions, precautions do apply. Patients over 36 years old may have a stronger reaction and need a smaller dose. Overdosage: If you think you have taken too much of this medicine contact a poison control center or emergency room at once. NOTE: This medicine is only for you. Do not share this medicine with others. What if I miss a dose? If you miss a dose, take it as soon as you can. If it is almost time for your next dose, take only that dose. Do not take double or extra doses. What may  interact with this medicine? Do not take this medicine with any of the following medications:  cisapride  dronedarone  pimozide  thioridazine This medicine may also interact with the following medications:  alcohol  antihistamines for allergy, cough, and cold  atropine  barbiturate medicines for sleep or seizures, like phenobarbital  certain antibiotics like erythromycin or clarithromycin  certain medicines for anxiety or sleep  certain medicines for bladder problems like oxybutynin, tolterodine  certain medicines for depression or psychotic disturbances  certain medicines for irregular heart beat  certain medicines for Parkinson's disease like benztropine, trihexyphenidyl  certain medicines for seizures like phenobarbital, primidone  certain medicines for stomach problems like dicyclomine, hyoscyamine  certain medicines for travel sickness like scopolamine  ipratropium  narcotic medicines for pain  other medicines that prolong the QT interval (an abnormal heart rhythm) like dofetilide This list may not describe all possible interactions. Give your health care provider a list of all the medicines, herbs, non-prescription drugs, or dietary supplements you use. Also tell them if you smoke, drink alcohol, or use illegal drugs. Some items may interact with your medicine. What should I watch for while using this medicine? Tell your doctor or health care professional if your symptoms do not improve. You may get drowsy or dizzy. Do not drive, use machinery, or do anything that needs mental alertness until you know how this medicine affects you. Do not stand or sit up quickly, especially if you are an older patient. This reduces the risk of dizzy or fainting spells. Alcohol may  interfere with the effect of this medicine. Avoid alcoholic drinks. Your mouth may get dry. Chewing sugarless gum or sucking hard candy, and drinking plenty of water may help. Contact your doctor if the  problem does not go away or is severe. This medicine may cause dry eyes and blurred vision. If you wear contact lenses you may feel some discomfort. Lubricating drops may help. See your eye doctor if the problem does not go away or is severe. If you are receiving skin tests for allergies, tell your doctor you are using this medicine. What side effects may I notice from receiving this medicine? Side effects that you should report to your doctor or health care professional as soon as possible:  allergic reactions like skin rash, itching or hives, swelling of the face, lips, or tongue  changes in vision  confusion  fast, irregular heartbeat  seizures  tremor  trouble passing urine or change in the amount of urine Side effects that usually do not require medical attention (report to your doctor or health care professional if they continue or are bothersome):  constipation  drowsiness  dry mouth  headache  tiredness This list may not describe all possible side effects. Call your doctor for medical advice about side effects. You may report side effects to FDA at 1-800-FDA-1088. Where should I keep my medicine? Keep out of the reach of children. Store at room temperature between 15 and 30 degrees C (59 and 86 degrees F). Keep container tightly closed. Throw away any unused medicine after the expiration date. NOTE: This sheet is a summary. It may not cover all possible information. If you have questions about this medicine, talk to your doctor, pharmacist, or health care provider.  2020 Elsevier/Gold Standard (2018-07-15 13:19:55) Paroxetine Controlled-Release Tablets What is this medicine? PAROXETINE (pa ROX e teen) is used to treat depression. It may also be used to treat anxiety disorders, obsessive compulsive disorder, panic attacks, post traumatic stress, and premenstrual dysphoric disorder (PMDD). This medicine may be used for other purposes; ask your health care provider or  pharmacist if you have questions. COMMON BRAND NAME(S): Paxil CR What should I tell my health care provider before I take this medicine? They need to know if you have any of these conditions:  bipolar disorder or a family history of bipolar disorder  bleeding disorders  glaucoma  heart disease  kidney disease  liver disease  low levels of sodium in the blood  seizures  suicidal thoughts, plans, or attempt; a previous suicide attempt by you or a family member  take MAOIs like Carbex, Eldepryl, Marplan, Nardil, and Parnate  take medicines that treat or prevent blood clots  thyroid disease  an unusual or allergic reaction to paroxetine, other medicines, foods, dyes, or preservatives  pregnant or trying to get pregnant  breast-feeding How should I use this medicine? Take this medicine by mouth with a glass of water. Follow the directions on the prescription label. You can take it with or without food. Do not crush or chew this medicine. Take your medicine at regular intervals. Do not take your medicine more often than directed. Do not stop taking this medicine suddenly except upon the advice of your doctor. Stopping this medicine too quickly may cause serious side effects or your condition may worsen. A special MedGuide will be given to you by the pharmacist with each prescription and refill. Be sure to read this information carefully each time. Talk to your pediatrician regarding the use of this  medicine in children. Special care may be needed. Overdosage: If you think you have taken too much of this medicine contact a poison control center or emergency room at once. NOTE: This medicine is only for you. Do not share this medicine with others. What if I miss a dose? If you miss a dose, take it as soon as you can. If it is almost time for your next dose, take only that dose. Do not take double or extra doses. What may interact with this medicine? Do not take this medicine with  any of the following medications:  linezolid  MAOIs like Carbex, Eldepryl, Marplan, Nardil, and Parnate  methylene blue (injected into a vein)  pimozide  thioridazine This medicine may also interact with the following medications:  alcohol  amphetamines  aspirin and aspirin-like medicines  atomoxetine  certain medicines for depression, anxiety, or psychotic disturbances  certain medicines for irregular heart beat like propafenone, flecainide, encainide, and quinidine  certain medicines for migraine headache like almotriptan, eletriptan, frovatriptan, naratriptan, rizatriptan, sumatriptan, zolmitriptan  cimetidine  digoxin  diuretics  fentanyl  fosamprenavir  furazolidone  isoniazid  lithium  medicines that treat or prevent blood clots like warfarin, enoxaparin, and dalteparin  medicines for sleep  NSAIDs, medicines for pain and inflammation, like ibuprofen or naproxen  phenobarbital  phenytoin  procarbazine  rasagiline  ritonavir  supplements like St. John's wort, kava kava, valerian  tamoxifen  tramadol  tryptophan This list may not describe all possible interactions. Give your health care provider a list of all the medicines, herbs, non-prescription drugs, or dietary supplements you use. Also tell them if you smoke, drink alcohol, or use illegal drugs. Some items may interact with your medicine. What should I watch for while using this medicine? Tell your doctor if your symptoms do not get better or if they get worse. Visit your doctor or health care professional for regular checks on your progress. Because it may take several weeks to see the full effects of this medicine, it is important to continue your treatment as prescribed by your doctor. Patients and their families should watch out for new or worsening thoughts of suicide or depression. Also watch out for sudden changes in feelings such as feeling anxious, agitated, panicky, irritable,  hostile, aggressive, impulsive, severely restless, overly excited and hyperactive, or not being able to sleep. If this happens, especially at the beginning of treatment or after a change in dose, call your health care professional. Dennis Bast may get drowsy or dizzy. Do not drive, use machinery, or do anything that needs mental alertness until you know how this medicine affects you. Do not stand or sit up quickly, especially if you are an older patient. This reduces the risk of dizzy or fainting spells. Alcohol may interfere with the effect of this medicine. Avoid alcoholic drinks. Your mouth may get dry. Chewing sugarless gum or sucking hard candy, and drinking plenty of water will help. Contact your doctor if the problem does not go away or is severe. What side effects may I notice from receiving this medicine? Side effects that you should report to your doctor or health care professional as soon as possible:  allergic reactions like skin rash, itching or hives, swelling of the face, lips, or tongue  anxious  black, tarry stools  changes in vision  confusion  elevated mood, decreased need for sleep, racing thoughts, impulsive behavior  eye pain  fast, irregular heartbeat  feeling faint or lightheaded, falls  feeling agitated, angry, or  irritable  hallucination, loss of contact with reality  loss of balance or coordination  loss of memory  painful or prolonged erections  restlessness, pacing, inability to keep still  seizures  stiff muscles  suicidal thoughts or other mood changes  trouble sleeping  unusual bleeding or bruising  unusually weak or tired  vomiting Side effects that usually do not require medical attention (report to your doctor or health care professional if they continue or are bothersome):  change in appetite or weight  change in sex drive or performance  diarrhea  dizziness  dry mouth  headache  increased sweating  indigestion,  nausea  tired  tremors This list may not describe all possible side effects. Call your doctor for medical advice about side effects. You may report side effects to FDA at 1-800-FDA-1088. Where should I keep my medicine? Keep out of the reach of children. Store at or below 25 degrees C (77 degrees F). Throw away any unused medicine after the expiration date. NOTE: This sheet is a summary. It may not cover all possible information. If you have questions about this medicine, talk to your doctor, pharmacist, or health care provider.  2020 Elsevier/Gold Standard (2015-12-25 15:48:13) Serotonin Syndrome Serotonin is a chemical in your body (neurotransmitter) that helps to control several functions, such as:  Brain and nerve cell function.  Mood and emotions.  Memory.  Eating.  Sleeping.  Sexual activity.  Stress response. Having too much serotonin in your body can cause serotonin syndrome. This condition can be harmful to your brain and nerve cells. This can be a life-threatening condition. What are the causes? This condition may be caused by taking medicines or drugs that increase the level of serotonin in your body, such as:  Antidepressant medicines.  Migraine medicines.  Certain pain medicines.  Certain drugs, including ecstasy, LSD, cocaine, and amphetamines.  Over-the-counter cough or cold medicines that contain dextromethorphan.  Certain herbal supplements, including St. John's wort, ginseng, and nutmeg. This condition usually occurs when you take these medicines or drugs in combination, but it can also happen with a high dose of a single medicine or drug. What increases the risk? You are more likely to develop this condition if:  You just started taking a medicine or drug that increases the level of serotonin in the body.  You recently increased the dose of a medicine or drug that increases the level of serotonin in the body.  You take more than one medicine or  drug that increases the level of serotonin in the body. What are the signs or symptoms? Symptoms of this condition usually start within several hours of taking a medicine or drug. Symptoms may be mild or severe. Mild symptoms include:  Sweating.  Restlessness or agitation.  Muscle twitching or stiffness.  Rapid heart rate.  Nausea and vomiting.  Diarrhea.  Headache.  Shivering or goose bumps.  Confusion. Severe symptoms include:  Irregular heartbeat.  Seizures.  Loss of consciousness.  High fever. How is this diagnosed? This condition may be diagnosed based on:  Your medical history.  A physical exam.  Your prior use of drugs and medicines.  Blood or urine tests. These may be used to rule out other causes of your symptoms. How is this treated? The treatment for this condition depends on the severity of your symptoms.  For mild cases, stopping the medicine or drug that caused your condition is usually all that is needed.  For moderate to severe cases, treatment in a  hospital may be needed to prevent or manage life-threatening symptoms. This may include medicines to control your symptoms, IV fluids, interventions to support your breathing, and treatments to control your body temperature. Follow these instructions at home: Medicines   Take over-the-counter and prescription medicines only as told by your health care provider. This is important.  Check with your health care provider before you start taking any new prescriptions, over-the-counter medicines, herbs, or supplements.  Avoid combining any medicines that can cause this condition to occur. Lifestyle   Maintain a healthy lifestyle. ? Eat a healthy diet that includes plenty of vegetables, fruits, whole grains, low-fat dairy products, and lean protein. Do not eat a lot of foods that are high in fat, added sugars, or salt. ? Get the right amount and quality of sleep. Most adults need 7-9 hours of sleep  each night. ? Make time to exercise, even if it is only for short periods of time. Most adults should exercise for at least 150 minutes each week. ? Do not drink alcohol. ? Do not use illegal drugs, and do not take medicines for reasons other than they are prescribed. General instructions  Do not use any products that contain nicotine or tobacco, such as cigarettes and e-cigarettes. If you need help quitting, ask your health care provider.  Keep all follow-up visits as told by your health care provider. This is important. Contact a health care provider if:  Your symptoms do not improve or they get worse. Get help right away if you:  Have worsening confusion, severe headache, chest pain, high fever, seizures, or loss of consciousness.  Experience serious side effects of medicine, such as swelling of your face, lips, tongue, or throat.  Have serious thoughts about hurting yourself or others. These symptoms may represent a serious problem that is an emergency. Do not wait to see if the symptoms will go away. Get medical help right away. Call your local emergency services (911 in the [place] Do not drive yourself to the hospital. If you ever feel like you may hurt yourself or others, or have thoughts about taking your own life, get help right away. You can go to your nearest emergency department or call:  Your local emergency services (911 in the [place]  A suicide crisis helpline, such as the [facility] at 905 851 0213. This is open 24 hours a day. Summary  Serotonin is a brain chemical that helps to regulate the nervous system. High levels of serotonin in the body can cause serotonin syndrome, which is a very dangerous condition.  This condition may be caused by taking medicines or drugs that increase the level of serotonin in your body.  Treatment depends on the severity of your symptoms. For mild cases, stopping the medicine or drug that caused your condition  is usually all that is needed.  Check with your health care provider before you start taking any new prescriptions, over-the-counter medicines, herbs, or supplements. This information is not intended to replace advice given to you by your health care provider. Make sure you discuss any questions you have with your health care provider. Document Revised: 08/31/2017 Document Reviewed: 08/31/2017 Elsevier Patient Education  2020 [employer].

## 2020-06-10 ENCOUNTER — Telehealth: Payer: Self-pay | Admitting: Psychiatry

## 2020-06-10 DIAGNOSIS — F411 Generalized anxiety disorder: Secondary | ICD-10-CM

## 2020-06-10 DIAGNOSIS — F331 Major depressive disorder, recurrent, moderate: Secondary | ICD-10-CM

## 2020-06-10 DIAGNOSIS — F633 Trichotillomania: Secondary | ICD-10-CM

## 2020-06-10 MED ORDER — PAROXETINE HCL 20 MG PO TABS: 10.0000 mg | ORAL_TABLET | Freq: Every day | ORAL | 1 refills | Status: DC

## 2020-06-10 NOTE — Telephone Encounter (Signed)
Received request from pharmacy that Paxil extended release is not on formulary. We will start Paxil 10 mg p.o. daily for a week and increase to 20 mg p.o. daily after that.

## 2020-06-15 ENCOUNTER — Encounter: Payer: Self-pay | Admitting: Licensed Clinical Social Worker

## 2020-06-15 ENCOUNTER — Ambulatory Visit (INDEPENDENT_AMBULATORY_CARE_PROVIDER_SITE_OTHER): Payer: No Typology Code available for payment source | Admitting: Licensed Clinical Social Worker

## 2020-06-15 ENCOUNTER — Other Ambulatory Visit: Payer: Self-pay

## 2020-06-15 DIAGNOSIS — F4310 Post-traumatic stress disorder, unspecified: Secondary | ICD-10-CM | POA: Diagnosis not present

## 2020-06-15 DIAGNOSIS — F1020 Alcohol dependence, uncomplicated: Secondary | ICD-10-CM

## 2020-06-15 DIAGNOSIS — F331 Major depressive disorder, recurrent, moderate: Secondary | ICD-10-CM | POA: Diagnosis not present

## 2020-06-15 NOTE — Progress Notes (Signed)
Virtual Visit via Telephone Note  I connected with Dawn Waller on 06/15/20 at  3:00 PM EST by telephone and verified that I am speaking with the correct person using two identifiers.  Location: Patient: Home Provider: Home Office   I discussed the limitations, risks, security and privacy concerns of performing an evaluation and management service by telephone and the availability of in person appointments. I also discussed with the patient that there may be a patient responsible charge related to this service. The patient expressed understanding and agreed to proceed.  THERAPY PROGRESS NOTE  Session Time: 30 Minutes  Participation Level: Active  Behavioral Response: AlertDepressed and Tearful  Type of Therapy: Individual Therapy  Treatment Goals addressed: Coping  Interventions: CBT  Summary: Dawn Waller is a 44 y.o. female who presents with depression sxs. Pt reported she was feeling "okay" and had difficulty connecting to video session invite so session was conducted via telephone. Pt identified stressors related to father's death with upcoming anniversary date and the holidays. Pt identified ritual she can perform to honor father's memory by releasing balloons where he was killed. Pt reported she has no plans for attending holiday events with family and has decided to stay home with her boyfriend. Pt reported that she is working towards cutting back on her drinking after appointment with doctor who examined her liver. Pt identified supports she has in place, that working is a great distraction, and ways to stay safe. Pt denied any SI, however acknowledges that alcohol use and hair pulling are defined as unsafe coping skills.   Suicidal/Homicidal: No  Therapist Response: Therapist met with patient for first session since completing CCA. Therapist and patient reviewed treatment plan and goals. Pt in agreement. Therapist and patient explored grief and attempts to cope.  Therapist oriented patient to therapeutic approach called Seeking Safety which is an evidenced based treatment manual for PTSD and substance abuse. Therapist explained importance of safety as the first phase of treatment and assisted patient in identifying unsafe coping habits. Pt was receptive. Therapist assigned patient homework to review Handout 2 - Safe Coping Skills between now and next session.  Plan: Return again in 2 weeks.  Diagnosis: Axis I: Post Traumatic Stress Disorder, Substance Abuse and MDD, Recurrent, Moderate    Axis II: N/A  Josephine Igo, LCSW, LCAS 06/15/2020

## 2020-06-29 ENCOUNTER — Ambulatory Visit (INDEPENDENT_AMBULATORY_CARE_PROVIDER_SITE_OTHER): Payer: No Typology Code available for payment source | Admitting: Licensed Clinical Social Worker

## 2020-06-29 ENCOUNTER — Other Ambulatory Visit: Payer: Self-pay

## 2020-06-29 ENCOUNTER — Encounter: Payer: Self-pay | Admitting: Licensed Clinical Social Worker

## 2020-06-29 DIAGNOSIS — F4310 Post-traumatic stress disorder, unspecified: Secondary | ICD-10-CM

## 2020-06-29 DIAGNOSIS — F1020 Alcohol dependence, uncomplicated: Secondary | ICD-10-CM | POA: Diagnosis not present

## 2020-06-29 DIAGNOSIS — F331 Major depressive disorder, recurrent, moderate: Secondary | ICD-10-CM

## 2020-06-29 NOTE — Progress Notes (Signed)
Virtual Visit via Video Note  I connected with [name] on 06/29/20 at  3:00 PM EST by a video enabled telemedicine application and verified that I am speaking with the correct person using two identifiers.  Participating Parties: Patient Provider  Location: Patient: Vehicle at [facility]: Home Office   I discussed the limitations of evaluation and management by telemedicine and the availability of in person appointments. The patient expressed understanding and agreed to proceed.  THERAPY PROGRESS NOTE  Session Time: 30 Minutes  Participation Level: Active  Behavioral Response: Well GroomedAlertAngry, Depressed and Tearful  Type of Therapy: Individual Therapy  Treatment Goals addressed: Anger and Coping  Interventions: CBT  Summary: Dawn Waller is a 44 y.o. female who presents with depression sxs. Pt reported she is doing "okay" while continuing to process the loss of her father. Pt reported "I push myself to get out of bed and go to work," however recognizes the importance of expressing her grief and allowing herself to feel sad and angry w/out judging her own experience. Pt identified useful coping skills and is receptive to looking into grief support groups.   Suicidal/Homicidal: No  Therapist Response: Therapist met with patient for follow up. Therapist and patient explored stages of grief and coping skills utilized. Therapist and patient reviewed homework from last assignment on safe coping skills.   Plan: Return again in 2 weeks.  Diagnosis: Axis I: Post Traumatic Stress Disorder, Substance Abuse and MDD, Recurrent, Moderate    Axis II: N/A  Josephine Igo, LCSW, LCAS 06/29/2020

## 2020-07-09 ENCOUNTER — Telehealth (INDEPENDENT_AMBULATORY_CARE_PROVIDER_SITE_OTHER): Payer: No Typology Code available for payment source | Admitting: Psychiatry

## 2020-07-09 ENCOUNTER — Other Ambulatory Visit: Payer: Self-pay

## 2020-07-09 ENCOUNTER — Encounter: Payer: Self-pay | Admitting: Psychiatry

## 2020-07-09 DIAGNOSIS — F1020 Alcohol dependence, uncomplicated: Secondary | ICD-10-CM

## 2020-07-09 DIAGNOSIS — F4312 Post-traumatic stress disorder, chronic: Secondary | ICD-10-CM

## 2020-07-09 DIAGNOSIS — F633 Trichotillomania: Secondary | ICD-10-CM

## 2020-07-09 DIAGNOSIS — F411 Generalized anxiety disorder: Secondary | ICD-10-CM | POA: Diagnosis not present

## 2020-07-09 DIAGNOSIS — F331 Major depressive disorder, recurrent, moderate: Secondary | ICD-10-CM

## 2020-07-09 MED ORDER — TOPIRAMATE 100 MG PO TABS: 50.0000 mg | ORAL_TABLET | Freq: Two times a day (BID) | ORAL | 1 refills | Status: DC

## 2020-07-09 MED ORDER — MIRTAZAPINE 15 MG PO TABS: 7.5000 mg | ORAL_TABLET | Freq: Every day | ORAL | 1 refills | Status: DC

## 2020-07-09 MED ORDER — PAROXETINE HCL 20 MG PO TABS: 20.0000 mg | ORAL_TABLET | Freq: Every day | ORAL | 1 refills | Status: DC

## 2020-07-09 NOTE — Patient Instructions (Signed)
Serotonin Syndrome Serotonin is a chemical in your body (neurotransmitter) that helps to control several functions, such as:  Brain and nerve cell function.  Mood and emotions.  Memory.  Eating.  Sleeping.  Sexual activity.  Stress response. Having too much serotonin in your body can cause serotonin syndrome. This condition can be harmful to your brain and nerve cells. This can be a life-threatening condition. What are the causes? This condition may be caused by taking medicines or drugs that increase the level of serotonin in your body, such as:  Antidepressant medicines.  Migraine medicines.  Certain pain medicines.  Certain drugs, including ecstasy, LSD, cocaine, and amphetamines.  Over-the-counter cough or cold medicines that contain dextromethorphan.  Certain herbal supplements, including St. John's wort, ginseng, and nutmeg. This condition usually occurs when you take these medicines or drugs in combination, but it can also happen with a high dose of a single medicine or drug. What increases the risk? You are more likely to develop this condition if:  You just started taking a medicine or drug that increases the level of serotonin in the body.  You recently increased the dose of a medicine or drug that increases the level of serotonin in the body.  You take more than one medicine or drug that increases the level of serotonin in the body. What are the signs or symptoms? Symptoms of this condition usually start within several hours of taking a medicine or drug. Symptoms may be mild or severe. Mild symptoms include:  Sweating.  Restlessness or agitation.  Muscle twitching or stiffness.  Rapid heart rate.  Nausea and vomiting.  Diarrhea.  Headache.  Shivering or goose bumps.  Confusion. Severe symptoms include:  Irregular heartbeat.  Seizures.  Loss of consciousness.  High fever. How is this diagnosed? This condition may be diagnosed based  on:  Your medical history.  A physical exam.  Your prior use of drugs and medicines.  Blood or urine tests. These may be used to rule out other causes of your symptoms. How is this treated? The treatment for this condition depends on the severity of your symptoms.  For mild cases, stopping the medicine or drug that caused your condition is usually all that is needed.  For moderate to severe cases, treatment in a hospital may be needed to prevent or manage life-threatening symptoms. This may include medicines to control your symptoms, IV fluids, interventions to support your breathing, and treatments to control your body temperature. Follow these instructions at home: Medicines   Take over-the-counter and prescription medicines only as told by your health care provider. This is important.  Check with your health care provider before you start taking any new prescriptions, over-the-counter medicines, herbs, or supplements.  Avoid combining any medicines that can cause this condition to occur. Lifestyle   Maintain a healthy lifestyle. ? Eat a healthy diet that includes plenty of vegetables, fruits, whole grains, low-fat dairy products, and lean protein. Do not eat a lot of foods that are high in fat, added sugars, or salt. ? Get the right amount and quality of sleep. Most adults need 7-9 hours of sleep each night. ? Make time to exercise, even if it is only for short periods of time. Most adults should exercise for at least 150 minutes each week. ? Do not drink alcohol. ? Do not use illegal drugs, and do not take medicines for reasons other than they are prescribed. General instructions  Do not use any products that contain nicotine  or tobacco, such as cigarettes and e-cigarettes. If you need help quitting, ask your health care provider.  Keep all follow-up visits as told by your health care provider. This is important. Contact a health care provider if:  Your symptoms do not  improve or they get worse. Get help right away if you:  Have worsening confusion, severe headache, chest pain, high fever, seizures, or loss of consciousness.  Experience serious side effects of medicine, such as swelling of your face, lips, tongue, or throat.  Have serious thoughts about hurting yourself or others. These symptoms may represent a serious problem that is an emergency. Do not wait to see if the symptoms will go away. Get medical help right away. Call your local emergency services (911 in the [place] Do not drive yourself to the hospital. If you ever feel like you may hurt yourself or others, or have thoughts about taking your own life, get help right away. You can go to your nearest emergency department or call:  Your local emergency services (911 in the [place]  A suicide crisis helpline, such as the [facility] at (463)543-7176. This is open 24 hours a day. Summary  Serotonin is a brain chemical that helps to regulate the nervous system. High levels of serotonin in the body can cause serotonin syndrome, which is a very dangerous condition.  This condition may be caused by taking medicines or drugs that increase the level of serotonin in your body.  Treatment depends on the severity of your symptoms. For mild cases, stopping the medicine or drug that caused your condition is usually all that is needed.  Check with your health care provider before you start taking any new prescriptions, over-the-counter medicines, herbs, or supplements. This information is not intended to replace advice given to you by your health care provider. Make sure you discuss any questions you have with your health care provider. Document Revised: 08/31/2017 Document Reviewed: 08/31/2017 Else[employer] Patient Education  [place]. Mirtazapine tablets What is this medicine? MIRTAZAPINE (mir TAZ a peen) is used to treat depression. This medicine may be used for other  purposes; ask your health care provider or pharmacist if you have questions. COMMON BRAND NAME(S): Remeron What should I tell my health care provider before I take this medicine? They need to know if you have any of these conditions:  bipolar disorder  glaucoma  kidney disease  liver disease  suicidal thoughts  an unusual or allergic reaction to mirtazapine, other medicines, foods, dyes, or preservatives  pregnant or trying to get pregnant  breast-feeding How should I use this medicine? Take this medicine by mouth with a glass of water. Follow the directions on the prescription label. Take your medicine at regular intervals. Do not take your medicine more often than directed. Do not stop taking this medicine suddenly except upon the advice of your doctor. Stopping this medicine too quickly may cause serious side effects or your condition may worsen. A special MedGuide will be given to you by the pharmacist with each prescription and refill. Be sure to read this information carefully each time. Talk to your pediatrician regarding the use of this medicine in children. Special care may be needed. Overdosage: If you think you have taken too much of this medicine contact a poison control center or emergency room at once. NOTE: This medicine is only for you. Do not share this medicine with others. What if I miss a dose? If you miss a dose, take it  as soon as you can. If it is almost time for your next dose, take only that dose. Do not take double or extra doses. What may interact with this medicine? Do not take this medicine with any of the following medications:  linezolid  MAOIs like Carbex, Eldepryl, Marplan, Nardil, and Parnate  methylene blue (injected into a vein) This medicine may also interact with the following medications:  alcohol  antiviral medicines for HIV or AIDS  certain medicines that treat or prevent blood clots like warfarin  certain medicines for depression,  anxiety, or psychotic disturbances  certain medicines for fungal infections like ketoconazole and itraconazole  certain medicines for migraine headache like almotriptan, eletriptan, frovatriptan, naratriptan, rizatriptan, sumatriptan, zolmitriptan  certain medicines for seizures like carbamazepine or phenytoin  certain medicines for sleep  cimetidine  erythromycin  fentanyl  lithium  medicines for blood pressure  nefazodone  rasagiline  rifampin  supplements like St. John's wort, kava kava, valerian  tramadol  tryptophan This list may not describe all possible interactions. Give your health care provider a list of all the medicines, herbs, non-prescription drugs, or dietary supplements you use. Also tell them if you smoke, drink alcohol, or use illegal drugs. Some items may interact with your medicine. What should I watch for while using this medicine? Tell your doctor if your symptoms do not get better or if they get worse. Visit your doctor or health care professional for regular checks on your progress. Because it may take several weeks to see the full effects of this medicine, it is important to continue your treatment as prescribed by your doctor. Patients and their families should watch out for new or worsening thoughts of suicide or depression. Also watch out for sudden changes in feelings such as feeling anxious, agitated, panicky, irritable, hostile, aggressive, impulsive, severely restless, overly excited and hyperactive, or not being able to sleep. If this happens, especially at the beginning of treatment or after a change in dose, call your health care professional. Dennis Bast may get drowsy or dizzy. Do not drive, use machinery, or do anything that needs mental alertness until you know how this medicine affects you. Do not stand or sit up quickly, especially if you are an older patient. This reduces the risk of dizzy or fainting spells. Alcohol may interfere with the effect  of this medicine. Avoid alcoholic drinks. This medicine may cause dry eyes and blurred vision. If you wear contact lenses you may feel some discomfort. Lubricating drops may help. See your eye doctor if the problem does not go away or is severe. Your mouth may get dry. Chewing sugarless gum or sucking hard candy, and drinking plenty of water may help. Contact your doctor if the problem does not go away or is severe. What side effects may I notice from receiving this medicine? Side effects that you should report to your doctor or health care professional as soon as possible:  allergic reactions like skin rash, itching or hives, swelling of the face, lips, or tongue  anxious  changes in vision  chest pain  confusion  elevated mood, decreased need for sleep, racing thoughts, impulsive behavior  eye pain  fast, irregular heartbeat  feeling faint or lightheaded, falls  feeling agitated, angry, or irritable  fever or chills, sore throat  hallucination, loss of contact with reality  loss of balance or coordination  mouth sores  redness, blistering, peeling or loosening of the skin, including inside the mouth  restlessness, pacing, inability to keep  still  seizures  stiff muscles  suicidal thoughts or other mood changes  trouble passing urine or change in the amount of urine  trouble sleeping  unusual bleeding or bruising  unusually weak or tired  vomiting Side effects that usually do not require medical attention (report to your doctor or health care professional if they continue or are bothersome):  change in appetite  constipation  dizziness  dry mouth  muscle aches or pains  nausea  tired  weight gain This list may not describe all possible side effects. Call your doctor for medical advice about side effects. You may report side effects to FDA at 1-800-FDA-1088. Where should I keep my medicine? Keep out of the reach of children. Store at room  temperature between 15 and 30 degrees C (59 and 86 degrees F) Protect from light and moisture. Throw away any unused medicine after the expiration date. NOTE: This sheet is a summary. It may not cover all possible information. If you have questions about this medicine, talk to your doctor, pharmacist, or health care provider.  2020 Elsevier/Gold Standard (2015-12-23 17:30:45) Topiramate tablets What is this medicine? TOPIRAMATE (toe PYRE a mate) is used to treat seizures in adults or children with epilepsy. It is also used for the prevention of migraine headaches. This medicine may be used for other purposes; ask your health care provider or pharmacist if you have questions. COMMON BRAND NAME(S): Topamax, Topiragen What should I tell my health care provider before I take this medicine? They need to know if you have any of these conditions:  bleeding disorders  kidney disease  lung or breathing disease, like asthma  suicidal thoughts, plans, or attempt; a previous suicide attempt by you or a family member  an unusual or allergic reaction to topiramate, other medicines, foods, dyes, or preservatives  pregnant or trying to get pregnant  breast-feeding How should I use this medicine? Take this medicine by mouth with a glass of water. Follow the directions on the prescription label. Do not cut, crush or chew this medicine. Swallow the tablets whole. You can take it with or without food. If it upsets your stomach, take it with food. Take your medicine at regular intervals. Do not take it more often than directed. Do not stop taking except on your doctor's advice. A special MedGuide will be given to you by the pharmacist with each prescription and refill. Be sure to read this information carefully each time. Talk to your pediatrician regarding the use of this medicine in children. While this drug may be prescribed for children as young as 55 years of age for selected conditions, precautions do  apply. Overdosage: If you think you have taken too much of this medicine contact a poison control center or emergency room at once. NOTE: This medicine is only for you. Do not share this medicine with others. What if I miss a dose? If you miss a dose, take it as soon as you can. If your next dose is to be taken in less than 6 hours, then do not take the missed dose. Take the next dose at your regular time. Do not take double or extra doses. What may interact with this medicine? This medicine may interact with the following medications:  acetazolamide  alcohol  antihistamines for allergy, cough, and cold  aspirin and aspirin-like medicines  atropine  birth control pills  certain medicines for anxiety or sleep  certain medicines for bladder problems like oxybutynin, tolterodine  certain medicines  for depression like amitriptyline, fluoxetine, sertraline  certain medicines for seizures like carbamazepine, phenobarbital, phenytoin, primidone, valproic acid, zonisamide  certain medicines for stomach problems like dicyclomine, hyoscyamine  certain medicines for travel sickness like scopolamine  certain medicines for Parkinson's disease like benztropine, trihexyphenidyl  certain medicines that treat or prevent blood clots like warfarin, enoxaparin, dalteparin, apixaban, dabigatran, and rivaroxaban  digoxin  general anesthetics like halothane, isoflurane, methoxyflurane, propofol  hydrochlorothiazide  ipratropium  lithium  medicines that relax muscles for surgery  metformin  narcotic medicines for pain  NSAIDs, medicines for pain and inflammation, like ibuprofen or naproxen  phenothiazines like chlorpromazine, mesoridazine, prochlorperazine, thioridazine  pioglitazone This list may not describe all possible interactions. Give your health care provider a list of all the medicines, herbs, non-prescription drugs, or dietary supplements you use. Also tell them if you  smoke, drink alcohol, or use illegal drugs. Some items may interact with your medicine. What should I watch for while using this medicine? Visit your doctor or health care professional for regular checks on your progress. Tell your health care professional if your symptoms do not start to get better or if they get worse. Do not stop taking except on your health care professional's advice. You may develop a severe reaction. Your health care professional will tell you how much medicine to take. Wear a medical ID bracelet or chain. Carry a card that describes your disease and details of your medicine and dosage times. This medicine can reduce the response of your body to heat or cold. Dress warm in cold weather and stay hydrated in hot weather. If possible, avoid extreme temperatures like saunas, hot tubs, very hot or cold showers, or activities that can cause dehydration such as vigorous exercise. Check with your health care professional if you have severe diarrhea, nausea, and vomiting, or if you sweat a lot. The loss of too much body fluid may make it dangerous for you to take this medicine. You may get drowsy or dizzy. Do not drive, use machinery, or do anything that needs mental alertness until you know how this medicine affects you. Do not stand up or sit up quickly, especially if you are an older patient. This reduces the risk of dizzy or fainting spells. Alcohol may interfere with the effect of this medicine. Avoid alcoholic drinks. Tell your health care professional right away if you have any change in your eyesight. Patients and their families should watch out for new or worsening depression or thoughts of suicide. Also watch out for sudden changes in feelings such as feeling anxious, agitated, panicky, irritable, hostile, aggressive, impulsive, severely restless, overly excited and hyperactive, or not being able to sleep. If this happens, especially at the beginning of treatment or after a change in  dose, call your healthcare professional. This medicine may cause serious skin reactions. They can happen weeks to months after starting the medicine. Contact your health care provider right away if you notice fevers or flu-like symptoms with a rash. The rash may be red or purple and then turn into blisters or peeling of the skin. Or, you might notice a red rash with swelling of the face, lips or lymph nodes in your neck or under your arms. Birth control may not work properly while you are taking this medicine. Talk to your health care professional about using an extra method of birth control. Women should inform their health care professional if they wish to become pregnant or think they might be pregnant. There is  a potential for serious side effects and harm to an unborn child. Talk to your health care professional for more information. What side effects may I notice from receiving this medicine? Side effects that you should report to your doctor or health care professional as soon as possible:  allergic reactions like skin rash, itching or hives, swelling of the face, lips, or tongue  blood in the urine  changes in vision  confusion  loss of memory  pain in lower back or side  pain when urinating  redness, blistering, peeling or loosening of the skin, including inside the mouth  signs and symptoms of bleeding such as bloody or black, tarry stools; red or dark brown urine; spitting up blood or brown material that looks like coffee grounds; red spots on the skin; unusual bruising or bleeding from the eyes, gums, or nose  signs and symptoms of increased acid in the body like breathing fast; fast heartbeat; headache; confusion; unusually weak or tired; nausea, vomiting  suicidal thoughts, mood changes  trouble speaking or understanding  unusual sweating  unusually weak or tired Side effects that usually do not require medical attention (report to your doctor or health care  professional if they continue or are bothersome):  dizziness  drowsiness  fever  loss of appetite  nausea, vomiting  pain, tingling, numbness in the hands or feet  stomach pain  tiredness  upset stomach This list may not describe all possible side effects. Call your doctor for medical advice about side effects. You may report side effects to FDA at 1-800-FDA-1088. Where should I keep my medicine? Keep out of the reach of children. Store at room temperature between 15 and 30 degrees C (59 and 86 degrees F). Throw away any unused medicine after the expiration date. NOTE: This sheet is a summary. It may not cover all possible information. If you have questions about this medicine, talk to your doctor, pharmacist, or health care provider.  2020 Elsevier/Gold Standard (2019-02-20 15:07:20)

## 2020-07-09 NOTE — Progress Notes (Signed)
Virtual Visit via Video Note  I connected with Dawn Waller on 07/09/20 at  9:00 AM EST by a video enabled telemedicine application and verified that I am speaking with the correct person using two identifiers.  Location Provider Location : [facility] Patient Location : Home  Participants: Patient , Provider    I discussed the limitations of evaluation and management by telemedicine and the availability of in person appointments. The patient expressed understanding and agreed to proceed.   I discussed the assessment and treatment plan with the patient. The patient was provided an opportunity to ask questions and all were answered. The patient agreed with the plan and demonstrated an understanding of the instructions.   The patient was advised to call back or seek an in-person evaluation if the symptoms worsen or if the condition fails to improve as anticipated.   [facility] MD OP Progress Note  07/09/2020 10:31 AM Dawn Waller  MRN:  [id number]  Chief Complaint:  Chief Complaint    Follow-up     HPI: Dawn Waller is a 44 year old African-American female, employed, lives in [place] with her boyfriend, has a history of MDD, GAD, trichotillomania, chronic PTSD, alcohol use disorder, obstructive sleep apnea noncompliant on CPAP, hypertension, migraine headaches, gastroesophageal reflux disease, low back pain, fatty liver, seasonal allergies was evaluated by telemedicine today.  Patient today reports she is currently struggling since it is the holidays and anniversary of the death of her father coming up.  She reports she has good days and bad days.  She however has been trying to push herself.  She reports last week was hard for her however she was able to get out and take a break as well as get some fresh air often which helped to some extent.  She is currently working with her therapist every 2 weeks.   Patient reports she continues to struggle with sleep.  She ran out of  her doxepin a week ago.  She reports even when she was on doxepin she slept only 4 hours.  She has been taking doxepin along with melatonin as well as a muscle relaxant.  Patient denies any suicidality, homicidality or perceptual disturbances.  Patient reports she continues to use alcohol on a regular basis.  She reports there are days when she drinks 2 to 3 glasses of wine.   Patient denies any other concerns today.  Visit Diagnosis:    ICD-10-CM   1. MDD (major depressive disorder), recurrent episode, moderate (HCC)  F33.1 mirtazapine (REMERON) 15 MG tablet    PARoxetine (PAXIL) 20 MG tablet  2. GAD (generalized anxiety disorder)  F41.1 mirtazapine (REMERON) 15 MG tablet    PARoxetine (PAXIL) 20 MG tablet  3. 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 Chronic post-traumatic stress disorder (PTSD)  F43.12   5. Alcohol use disorder, moderate, dependence (HCC)  F10.20 topiramate (TOPAMAX) 100 MG tablet    Past Psychiatric History: I have reviewed past psychiatric history from my progress note on 06/09/2020.  Past trials of Zoloft, doxepin, melatonin  Past Medical History:  Past Medical History:  Diagnosis Date  . Back pain   . Chronic headaches   . Depression   . Fatty liver   . GERD (gastroesophageal reflux disease)   . Hypertension   . Migraine   . OSA on CPAP   . Seasonal allergies     Past Surgical History:  Procedure Laterality Date  .  CHOLECYSTECTOMY     October 08, 2018    Family Psychiatric History: Reviewed family psychiatric history from my progress note on 06/09/2020  Family History:  Family History  Problem Relation Age of Onset  . Healthy Mother   . Healthy Father   . Breast cancer Neg Hx     Social History: Reviewed social history from my progress note on 06/09/2020 Social History   Socioeconomic History  . Marital status: Single    Spouse name: Not on file  . Number of children: Not on file  .  Years of education: Not on file  . Highest education level: Not on file  Occupational History  . Not on file  Tobacco Use  . Smoking status: Never Smoker  . Smokeless tobacco: Never Used  Vaping Use  . Vaping Use: Never used  Substance and Sexual Activity  . Alcohol use: Yes    Alcohol/week: 7.0 standard drinks    Types: 7 Glasses of wine per week    Comment: wine or vodka mixed with soda  . Drug use: Never  . Sexual activity: Not Currently    Partners: Male    Birth control/protection: Injection  Other Topics Concern  . Not on file  Social History Narrative  . Not on file   Social Determinants of Health   Financial Resource Strain:   . Difficulty of Paying Living Expenses: Not on file  Food Insecurity:   . Worried About Charity fundraiser in the Last Year: Not on file  . Ran Out of Food in the Last Year: Not on file  Transportation Needs:   . Lack of Transportation (Medical): Not on file  . Lack of Transportation (Non-Medical): Not on file  Physical Activity:   . Days of Exercise per Week: Not on file  . Minutes of Exercise per Session: Not on file  Stress:   . Feeling of Stress : Not on file  Social Connections:   . Frequency of Communication with Friends and Family: Not on file  . Frequency of Social Gatherings with Friends and Family: Not on file  . Attends Religious Services: Not on file  . Active Member of Clubs or Organizations: Not on file  . Attends Archivist Meetings: Not on file  . Marital Status: Not on file    Allergies:  Allergies  Allergen Reactions  . Sumatriptan   . Elavil [Amitriptyline]     Metabolic Disorder Labs: No results found for: HGBA1C, MPG No results found for: PROLACTIN No results found for: CHOL, TRIG, HDL, CHOLHDL, VLDL, LDLCALC No results found for: TSH  Therapeutic Level Labs: No results found for: LITHIUM No results found for: VALPROATE No components found for:  CBMZ  Current Medications: Current  Outpatient Medications  Medication Sig Dispense Refill  . albuterol (PROVENTIL HFA;VENTOLIN HFA) 108 (90 Base) MCG/ACT inhaler Inhale into the lungs.    . budesonide-formoterol (SYMBICORT) 160-4.5 MCG/ACT inhaler Inhale into the lungs.    . cetirizine (ZYRTEC) 10 MG tablet Take by mouth.    . cholecalciferol (VITAMIN D3) 10 MCG (400 UNIT) TABS tablet Take 1,000 Units by mouth.    . cyclobenzaprine (FLEXERIL) 10 MG tablet Take 10 mg by mouth 3 (three) times daily as needed for muscle spasms.    Marland Kitchen doxepin (SINEQUAN) 25 MG capsule Take 25 mg by mouth.    . esomeprazole (NEXIUM) 20 MG capsule Take 20 mg by mouth daily at 12 noon.    . famotidine (PEPCID) 20 MG  tablet Take 20 mg by mouth 2 (two) times daily.    . fluticasone (FLONASE) 50 MCG/ACT nasal spray Place into the nose.    . hydrOXYzine (ATARAX/VISTARIL) 25 MG tablet Take 0.5-1 tablets (12.5-25 mg total) by mouth daily as needed for anxiety. 30 tablet 1  . Melatonin 10 MG CAPS Take 10 mg by mouth.    . mirtazapine (REMERON) 15 MG tablet Take 0.5-1 tablets (7.5-15 mg total) by mouth at bedtime. For sleep and mood 30 tablet 1  . Multiple Vitamins-Minerals (MULTIVITAMIN WITH MINERALS) tablet Take 1 tablet by mouth daily. (Patient not taking: Reported on 06/09/2020) 100 tablet 0  . PARoxetine (PAXIL) 20 MG tablet Take 1 tablet (20 mg total) by mouth daily. 30 tablet 1  . topiramate (TOPAMAX) 100 MG tablet Take 0.5 tablets (50 mg total) by mouth 2 (two) times daily. For alcoholism 30 tablet 1  . verapamil (CALAN) 120 MG tablet Take 120 mg by mouth 2 (two) times daily.     Current Facility-Administered Medications  Medication Dose Route Frequency Provider Last Rate Last Admin  . medroxyPROGESTERone (DEPO-PROVERA) injection 150 mg  150 mg Intramuscular Q90 days Staples, Jenna L, PA-C   150 mg at 05/28/20 1700     Musculoskeletal: Strength & Muscle Tone: UTA Gait & Station: UTA Patient leans: N/A  Psychiatric Specialty Exam: Review of  Systems  Psychiatric/Behavioral: Positive for dysphoric mood and sleep disturbance. The patient is nervous/anxious.   All other systems reviewed and are negative.   There were no vitals taken for this visit.There is no height or weight on file to calculate BMI.  General Appearance: Casual  Eye Contact:  Fair  Speech:  Clear and Coherent  Volume:  Normal  Mood:  Anxious and Depressed  Affect:  Tearful  Thought Process:  Goal Directed and Descriptions of Associations: Intact  Orientation:  Full (Time, Place, and Person)  Thought Content: Logical   Suicidal Thoughts:  No  Homicidal Thoughts:  No  Memory:  Immediate;   Fair Recent;   Fair Remote;   Fair  Judgement:  Fair  Insight:  Fair  Psychomotor Activity:  Normal  Concentration:  Concentration: Fair and Attention Span: Fair  Recall:  [employer] of Knowledge: Fair  Language: Fair  Akathisia:  No  Handed:  Right  AIMS (if indicated): UTA  Assets:  Communication Skills Desire for Improvement Housing Social Support  ADL's:  Intact  Cognition: WNL  Sleep:  Poor   Screenings: GAD-7     Video Visit from 06/09/2020 in [facility]  Total GAD-7 Score 18    PHQ2-9     Video Visit from 06/09/2020 in [facility] Visit from 12/16/2019 in [facility]  PHQ-2 Total Score 6 6  PHQ-9 Total Score 19 15       Assessment and Plan: Dawn Waller is a 8 year old African-American female, lives with her boyfriend, employed, has a history of depression, PTSD, trichotillomania, GAD, alcohol use disorder, multiple medical problems including obstructive sleep apnea noncompliant on CPAP, fatty liver, hypertension, migraine headaches, GERD was evaluated by telemedicine today.  Patient is biologically predisposed given her history of trauma, multiple medical problems.  Patient with psychosocial stressors of death of her father, current pandemic, will benefit  from continued medication management for her mood symptoms, alcoholism as well as sleep problems.  She will also benefit from psychotherapy sessions.  Plan as noted below.  Plan MDD-unstable Continue Paxil 20 mg p.o.  daily Discontinue doxepin due to lack of benefit. Start mirtazapine 7.5 to 15 mg p.o. nightly for sleep and mood. Continue CBT with Ms. Zadie Rhine. Patient with obstructive sleep apnea, noncompliant with CPAP.   GAD-unstable Paxil 20 mg p.o. daily Start Remeron as prescribed Hydroxyzine 12.5 to 25 mg p.o. daily as needed for severe anxiety attacks  Trichotillomania-improving Continue CBT Patient will also benefit from a referral to dermatology, she will discuss with primary care provider. Continue CBT  Alcohol use disorder moderate-some improvement Provided counseling Start Topamax 50 mg p.o. twice daily.  She was previously on Topamax for headaches. Patient to continue CBT.  Also discussed AA meetings.  PTSD-chronic We will monitor closely  Patient will benefit from labs-TSH, as well as EKG to monitor QTC  Discussed drug to drug interaction, including serotonin syndrome when combining antidepressants with medications like sumatriptan which she takes for migraine headaches.  She will monitor herself closely.  I have reviewed medical records from Ms. Lauren Oreilly-dated 06/29/2020-patient with PTSD, substance use disorder, MDD-currently working on coping skills, grief.'  Follow-up in clinic in 4 weeks or sooner if needed.  I have spent atleast 30 minutes face to face by video with patient today. More than 50 % of the time was spent for preparing to see the patient ( e.g., review of test, records ), obtaining and to review and separately obtained history , ordering medications and test ,psychoeducation and supportive psychotherapy and care coordination,as well as documenting clinical information in electronic health record. This note was generated in part or  whole with voice recognition software. Voice recognition is usually quite accurate but there are transcription errors that can and very often do occur. I apologize for any typographical errors that were not detected and corrected.      Ursula Alert, MD 07/09/2020, 10:31 AM

## 2020-07-16 ENCOUNTER — Ambulatory Visit: Payer: No Typology Code available for payment source | Admitting: Licensed Clinical Social Worker

## 2020-07-16 ENCOUNTER — Other Ambulatory Visit: Payer: Self-pay

## 2020-08-09 ENCOUNTER — Other Ambulatory Visit: Payer: Self-pay

## 2020-08-09 ENCOUNTER — Encounter: Payer: Self-pay | Admitting: Psychiatry

## 2020-08-09 ENCOUNTER — Telehealth (INDEPENDENT_AMBULATORY_CARE_PROVIDER_SITE_OTHER): Payer: No Typology Code available for payment source | Admitting: Psychiatry

## 2020-08-09 DIAGNOSIS — F411 Generalized anxiety disorder: Secondary | ICD-10-CM | POA: Diagnosis not present

## 2020-08-09 DIAGNOSIS — F1020 Alcohol dependence, uncomplicated: Secondary | ICD-10-CM

## 2020-08-09 DIAGNOSIS — F331 Major depressive disorder, recurrent, moderate: Secondary | ICD-10-CM | POA: Diagnosis not present

## 2020-08-09 DIAGNOSIS — F633 Trichotillomania: Secondary | ICD-10-CM

## 2020-08-09 DIAGNOSIS — F4312 Post-traumatic stress disorder, chronic: Secondary | ICD-10-CM

## 2020-08-09 NOTE — Progress Notes (Signed)
Virtual Visit via Video Note  I connected with Dawn Waller on 08/09/20 at  3:00 PM EST by a video enabled telemedicine application and verified that I am speaking with the correct person using two identifiers. Location Provider Location : [facility] Patient Location : Home  Participants: Patient , Provider   I discussed the limitations of evaluation and management by telemedicine and the availability of in person appointments. The patient expressed understanding and agreed to proceed.    I discussed the assessment and treatment plan with the patient. The patient was provided an opportunity to ask questions and all were answered. The patient agreed with the plan and demonstrated an understanding of the instructions.   The patient was advised to call back or seek an in-person evaluation if the symptoms worsen or if the condition fails to improve as anticipated.   [facility] MD OP Progress Note  08/09/2020 5:36 PM Dawn Waller  MRN:  [id number]  Chief Complaint:  Chief Complaint    Follow-up     HPI: Dawn Waller is a 45 year old African-American female, employed, lives in [place] with her boyfriend, has a history of MDD, GAD, trichotillomania, chronic PTSD, alcohol use disorder, obstructive sleep apnea noncompliant on CPAP, hypertension, migraine headaches, gastroesophageal reflux disease, low back pain, fatty liver, seasonal allergies was evaluated by telemedicine today.  Patient today reports it was her late father's birthday on Friday.  Patient reports she did not feel emotionally ready to do anything to celebrate that day and hence decided to stay home.  Patient reports she continues to grieve the loss of her dad on a daily basis.  Patient reports the mirtazapine is beneficial with her sleep however last night she could not sleep and had a lot of racing thoughts.  However overall sleep is better than before on the mirtazapine.  She denies side effects.  Patient  continues to use alcohol on a daily basis.  She however reports she has been trying to cut back.  Some days she uses 1-1/2 glass of wine and other days 2 to 3 glasses of wine.  She does have Topamax available however has not been very compliant with it.  She denies side effects.  Patient denies any suicidality, homicidality or perceptual disturbances.  Patient is motivated to stay in psychotherapy sessions and has upcoming appointment with therapist tomorrow.  Visit Diagnosis:    ICD-10-CM   1. MDD (major depressive disorder), recurrent episode, moderate (HCC)  F33.1   2. GAD (generalized anxiety disorder)  F41.1   3. 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 Chronic post-traumatic stress disorder (PTSD)  F43.12   5. Alcohol use disorder, moderate, dependence ([facility])  F10.20     Past Psychiatric History: I have reviewed past psychiatric history from my progress note on 06/09/2020.  Past trials of Zoloft, doxepin, melatonin  Past Medical History:  Past Medical History:  Diagnosis Date  . Back pain   . Chronic headaches   . Depression   . Fatty liver   . GERD (gastroesophageal reflux disease)   . Hypertension   . Migraine   . OSA on CPAP   . Seasonal allergies     Past Surgical History:  Procedure Laterality Date  . CHOLECYSTECTOMY     October 08, 2018    Family Psychiatric History: I have reviewed family psychiatric history from my progress note on 06/09/2020  Family History:  Family History  Problem Relation Age of Onset  . Healthy Mother   . Healthy Father   .  Breast cancer Neg Hx     Social History: I have reviewed social history from my progress note on 06/09/2020 Social History   Socioeconomic History  . Marital status: Single    Spouse name: Not on file  . Number of children: Not on file  . Years of education: Not on file  . Highest education level: Not on file  Occupational History  . Not on file  Tobacco Use  . Smoking status: Never Smoker  . Smokeless tobacco: Never Used   Vaping Use  . Vaping Use: Never used  Substance and Sexual Activity  . Alcohol use: Yes    Alcohol/week: 7.0 standard drinks    Types: 7 Glasses of wine per week    Comment: wine or vodka mixed with soda  . Drug use: Never  . Sexual activity: Not Currently    Partners: Male    Birth control/protection: Injection  Other Topics Concern  . Not on file  Social History Narrative  . Not on file   Social Determinants of Health   Financial Resource Strain: Not on file  Food Insecurity: Not on file  Transportation Needs: Not on file  Physical Activity: Not on file  Stress: Not on file  Social Connections: Not on file    Allergies:  Allergies  Allergen Reactions  . Sumatriptan   . Elavil [Amitriptyline]     Metabolic Disorder Labs: No results found for: HGBA1C, MPG No results found for: PROLACTIN No results found for: CHOL, TRIG, HDL, CHOLHDL, VLDL, LDLCALC No results found for: TSH  Therapeutic Level Labs: No results found for: LITHIUM No results found for: VALPROATE No components found for:  CBMZ  Current Medications: Current Outpatient Medications  Medication Sig Dispense Refill  . albuterol (PROVENTIL HFA;VENTOLIN HFA) 108 (90 Base) MCG/ACT inhaler Inhale into the lungs.    . budesonide-formoterol (SYMBICORT) 160-4.5 MCG/ACT inhaler Inhale into the lungs.    . cetirizine (ZYRTEC) 10 MG tablet Take by mouth.    . cholecalciferol (VITAMIN D3) 10 MCG (400 UNIT) TABS tablet Take 1,000 Units by mouth.    . cyclobenzaprine (FLEXERIL) 10 MG tablet Take 10 mg by mouth 3 (three) times daily as needed for muscle spasms.    Marland Kitchen esomeprazole (NEXIUM) 20 MG capsule Take 20 mg by mouth daily at 12 noon.    . famotidine (PEPCID) 20 MG tablet Take 20 mg by mouth 2 (two) times daily.    . fluticasone (FLONASE) 50 MCG/ACT nasal spray Place into the nose.    . hydrOXYzine (ATARAX/VISTARIL) 25 MG tablet Take 0.5-1 tablets (12.5-25 mg total) by mouth daily as needed for anxiety. 30 tablet  1  . Melatonin 10 MG CAPS Take 10 mg by mouth.    . mirtazapine (REMERON) 15 MG tablet Take 0.5-1 tablets (7.5-15 mg total) by mouth at bedtime. For sleep and mood 30 tablet 1  . Multiple Vitamins-Minerals (MULTIVITAMIN WITH MINERALS) tablet Take 1 tablet by mouth daily. (Patient not taking: Reported on 06/09/2020) 100 tablet 0  . PARoxetine (PAXIL) 20 MG tablet Take 1 tablet (20 mg total) by mouth daily. 30 tablet 1  . topiramate (TOPAMAX) 100 MG tablet Take 0.5 tablets (50 mg total) by mouth 2 (two) times daily. For alcoholism 30 tablet 1  . verapamil (CALAN) 120 MG tablet Take 120 mg by mouth 2 (two) times daily.     Current Facility-Administered Medications  Medication Dose Route Frequency Provider Last Rate Last Admin  . medroxyPROGESTERone (DEPO-PROVERA) injection 150 mg  150  mg Intramuscular Q90 days Staples, Jenna L, PA-C   150 mg at 05/28/20 1700     Musculoskeletal: Strength & Muscle Tone: UTA Gait & Station: UTA Patient leans: N/A  Psychiatric Specialty Exam: Review of Systems  Psychiatric/Behavioral: Positive for dysphoric mood and sleep disturbance.  All other systems reviewed and are negative.   There were no vitals taken for this visit.There is no height or weight on file to calculate BMI.  General Appearance: Casual  Eye Contact:  Fair  Speech:  Clear and Coherent  Volume:  Normal  Mood:  Dysphoric  Affect:  Constricted  Thought Process:  Goal Directed and Descriptions of Associations: Intact  Orientation:  Full (Time, Place, and Person)  Thought Content: Logical   Suicidal Thoughts:  No  Homicidal Thoughts:  No  Memory:  Immediate;   Fair Recent;   Fair Remote;   Fair  Judgement:  Fair  Insight:  Fair  Psychomotor Activity:  Normal  Concentration:  Concentration: Fair and Attention Span: Fair  Recall:  [employer] of Knowledge: Fair  Language: Fair  Akathisia:  No  Handed:  Right  AIMS (if indicated): UTA  Assets:  Communication Skills Desire for  Improvement Housing Intimacy Social Support Talents/Skills Transportation Vocational/Educational  ADL's:  Intact  Cognition: WNL  Sleep:  Restless   Screenings: GAD-7   Flowsheet Row Video Visit from 06/09/2020 in [facility]  Total GAD-7 Score 18    PHQ2-9   Flowsheet Row Video Visit from 06/09/2020 in [facility] Visit from 12/16/2019 in [facility]  PHQ-2 Total Score 6 6  PHQ-9 Total Score 19 15       Assessment and Plan: Dawn Waller is a 85 year old African-American female, lives with her boyfriend, employed, has a history of depression, PTSD, trichotillomania, GAD, alcohol use disorder, multiple medical problems was evaluated by telemedicine today.  She is biologically predisposed given her history of trauma, multiple medical problems.  Patient with psychosocial stressors of death of her father, current pandemic.  Patient however is currently noncompliant with some of her medications.  Discussed plan as noted below.  Plan MDD-some progress Paxil 20 mg p.o. daily Mirtazapine 15 mg p.o. nightly for sleep and mood. Continue CBT with Ms. Zadie Rhine Patient to use CPAP for obstructive sleep apnea, encouraged to do so.  GAD-some progress Paxil 20 mg p.o. daily Mirtazapine as prescribed Hydroxyzine 12.5-25 mg p.o. daily as needed for severe anxiety attacks  Trichotillomania-improving Continue CBT Patient will also benefit from referral to dermatology. Patient to talk to her primary care provider about the same. Continue CBT  Alcohol use disorder-moderate-some progress Topamax 50 mg p.o. twice daily-patient has been noncompliant.  Encouraged compliance. Patient would also benefit from [facility] meetings which was discussed with patient in the past. Continue CBT  PTSD-chronic Will monitor closely  Patient encouraged to get labs-TSH as well as EKG to monitor QTC.  Follow-up in  clinic in 4 weeks or sooner if needed.  I have spent atleast 20 minutes face to face by video with patient today. More than 50 % of the time was spent for preparing to see the patient ( e.g., review of test, records ), ordering medications and test ,psychoeducation and supportive psychotherapy and care coordination,as well as documenting clinical information in electronic health record. This note was generated in part or whole with voice recognition software. Voice recognition is usually quite accurate but there are transcription errors that can and very  often do occur. I apologize for any typographical errors that were not detected and corrected.       Ursula Alert, MD 08/10/2020, 12:51 PM

## 2020-08-10 ENCOUNTER — Encounter: Payer: Self-pay | Admitting: Licensed Clinical Social Worker

## 2020-08-10 ENCOUNTER — Other Ambulatory Visit: Payer: Self-pay

## 2020-08-10 ENCOUNTER — Ambulatory Visit (INDEPENDENT_AMBULATORY_CARE_PROVIDER_SITE_OTHER): Payer: No Typology Code available for payment source | Admitting: Licensed Clinical Social Worker

## 2020-08-10 DIAGNOSIS — F1020 Alcohol dependence, uncomplicated: Secondary | ICD-10-CM | POA: Diagnosis not present

## 2020-08-10 DIAGNOSIS — F331 Major depressive disorder, recurrent, moderate: Secondary | ICD-10-CM

## 2020-08-10 DIAGNOSIS — F4312 Post-traumatic stress disorder, chronic: Secondary | ICD-10-CM

## 2020-08-10 DIAGNOSIS — F411 Generalized anxiety disorder: Secondary | ICD-10-CM

## 2020-08-10 NOTE — Progress Notes (Signed)
Virtual Visit via Video Note  I connected with Dawn Waller on 08/10/20 at  3:00 PM EST by a video enabled telemedicine application and verified that I am speaking with the correct person using two identifiers.  Participating Parties Patient Provider  Location: Patient: Vehicle at worksite Provider: Home office   I discussed the limitations of evaluation and management by telemedicine and the availability of in person appointments. The patient expressed understanding and agreed to proceed.  THERAPY PROGRESS NOTE  Session Time: 6 Minutes  Participation Level: Active  Behavioral Response: Well GroomedAlertDepressed and Tearful  Type of Therapy: Individual Therapy  Treatment Goals addressed: Coping  Interventions: CBT  Summary: Dawn Waller is a 45 y.o. female who presents with depression sxs. Pt reported "last month was really bad, but I am still pushing". Pt reported she continues to distract herself while at work, but once she is home at night trying to sleep she ruminates about her father's death. Pt reported she continues to drink daily, however has reduced from 2-3 glasses of wine to only 1 glass. Pt reported she is interested in pursuing AA classes. Pt reported she has requested time off from work and is considering going out to a cabin with her boyfriend. Pt opened up more about her thoughts and feelings related to grief and loss as well as the person responsible for her father's death. Pt reported it was helpful to "get this off my chest" and felt better talking about it instead of holding everything in.  Suicidal/Homicidal: No  Therapist Response: Therapist met with patient for follow up. Therapist and patient explored sxs and use of coping skills. Therapist and patient processed thoughts and feelings related to father's death and the person responsible. Therapist to provide list of local [facility] meetings. Pt was receptive.  Plan: Return again in 1  week.  Diagnosis: Axis I: Alcohol Abuse, Generalized Anxiety Disorder, Post Traumatic Stress Disorder and MDD, Recurrent, Moderate    Axis II: N/A  Josephine Igo, LCSW, LCAS 08/10/2020

## 2020-08-20 ENCOUNTER — Encounter: Payer: Self-pay | Admitting: Licensed Clinical Social Worker

## 2020-08-20 ENCOUNTER — Other Ambulatory Visit: Payer: Self-pay

## 2020-08-20 ENCOUNTER — Ambulatory Visit (INDEPENDENT_AMBULATORY_CARE_PROVIDER_SITE_OTHER): Payer: No Typology Code available for payment source | Admitting: Licensed Clinical Social Worker

## 2020-08-20 DIAGNOSIS — F331 Major depressive disorder, recurrent, moderate: Secondary | ICD-10-CM | POA: Diagnosis not present

## 2020-08-20 DIAGNOSIS — F411 Generalized anxiety disorder: Secondary | ICD-10-CM | POA: Diagnosis not present

## 2020-08-20 DIAGNOSIS — F1020 Alcohol dependence, uncomplicated: Secondary | ICD-10-CM | POA: Diagnosis not present

## 2020-08-20 DIAGNOSIS — F4312 Post-traumatic stress disorder, chronic: Secondary | ICD-10-CM | POA: Diagnosis not present

## 2020-08-20 NOTE — Progress Notes (Signed)
Virtual Visit via Video Note  I connected with Dawn Waller on 08/20/20 at  9:00 AM EST by a video enabled telemedicine application and verified that I am speaking with the correct person using two identifiers.  Participating Parties Patient Provider  Location: Patient: Home Provider: Home Office   I discussed the limitations of evaluation and management by telemedicine and the availability of in person appointments. The patient expressed understanding and agreed to proceed.   THERAPY PROGRESS NOTE  Session Time: 20 Minutes  Participation Level: Active  Behavioral Response: Well GroomedAlertDepressed  Type of Therapy: Individual Therapy  Treatment Goals addressed: Coping  Interventions: Supportive  Summary: Dawn Waller is a 45 y.o. female who presents with depression sxs. Pt reported she is "doing alright" today and has officially requested time off for week of 08/30/20. Pt reported she is looking forward to using her time off to engage in self-care and is feeling motivated to get back into crafts as well as looking up [place] meetings to attend. Pt reported she still thinks about the person responsible for her father's death and if this person will try to reach out and contact her. Pt reported she began aquatic physical therapy since last session and finds being in or near water to be relaxing. Pt appears to be more stable with managing sxs and able to talk more without becoming tearful.   Suicidal/Homicidal: No  Therapist Response: Therapist met with patient for follow up. Therapist and patient explored improvement in sxs and plans to care for self by engaging in pleasant activities and attending [place] meetings.  Plan: Return again in 1 week.  Diagnosis: Axis I: Alcohol Abuse, Generalized Anxiety Disorder, Post Traumatic Stress Disorder and MDD, Recurrent, Moderate    Axis II: N/A  Josephine Igo, LCSW, LCAS 08/20/2020

## 2020-08-25 ENCOUNTER — Encounter: Payer: Self-pay | Admitting: Licensed Clinical Social Worker

## 2020-08-25 ENCOUNTER — Other Ambulatory Visit: Payer: Self-pay

## 2020-08-25 ENCOUNTER — Ambulatory Visit (INDEPENDENT_AMBULATORY_CARE_PROVIDER_SITE_OTHER): Payer: No Typology Code available for payment source | Admitting: Licensed Clinical Social Worker

## 2020-08-25 DIAGNOSIS — F1020 Alcohol dependence, uncomplicated: Secondary | ICD-10-CM | POA: Diagnosis not present

## 2020-08-25 DIAGNOSIS — F411 Generalized anxiety disorder: Secondary | ICD-10-CM

## 2020-08-25 DIAGNOSIS — F331 Major depressive disorder, recurrent, moderate: Secondary | ICD-10-CM | POA: Diagnosis not present

## 2020-08-25 DIAGNOSIS — F4312 Post-traumatic stress disorder, chronic: Secondary | ICD-10-CM

## 2020-08-25 NOTE — Progress Notes (Signed)
Virtual Visit via Video Note  I connected with Dawn Waller on 08/25/20 at  1:00 PM EST by a video enabled telemedicine application and verified that I am speaking with the correct person using two identifiers.  Participating Parties Patient Provider  Location: Patient: Home Provider: Home Office   I discussed the limitations of evaluation and management by telemedicine and the availability of in person appointments. The patient expressed understanding and agreed to proceed.  THERAPY PROGRESS NOTE  Session Time: 30 Minutes  Participation Level: Active  Behavioral Response: Well GroomedAlertAnxious  Type of Therapy: Individual Therapy  Treatment Goals addressed: Anxiety and Coping  Interventions: CBT  Summary: Dawn Waller is a 45 y.o. female who presents with depression sxs. Pt reported "last night I couldn't go to sleep. I was thinking about my Dad". Pt went on to explain that her father used to drink on a daily basis, but that night that he died they found no alcohol in his toxicology report. Pt reported "I was very proud of him. Thinking about this is giving me motivation in so many ways. I don't want to drink anymore. I don't want to have to depend on anything for sleep. I want a positive mindset. I know I can do this". Pt identified her goals for the year and acknowledged the progress she has made and will continue to make "to be prepared for what is in store for me". Pt also reported "a part of me is scared that my mind will take me places I don't want to go" during her vacation next week, however is determined to "think positive". Pt reported "it always feels a little better to release this" and believes therapy is beneficial in helping her heal.  Suicidal/Homicidal: No  Therapist Response: Therapist met with patient for follow up. Therapist and patient reviewed progress towards goals and continued barriers for 3 month treatment plan update. Therapist and  patient processed thoughts, feelings and insights.   Plan: Return again in 2 weeks.  Diagnosis: Axis I: Alcohol Abuse, Generalized Anxiety Disorder, Post Traumatic Stress Disorder and MDD, Recurrent, Moderate    Axis II: N/A  Josephine Igo, LCSW, LCAS 08/25/2020

## 2020-08-27 ENCOUNTER — Ambulatory Visit: Payer: Self-pay

## 2020-08-30 ENCOUNTER — Ambulatory Visit (LOCAL_COMMUNITY_HEALTH_CENTER): Payer: Self-pay

## 2020-08-30 ENCOUNTER — Other Ambulatory Visit: Payer: Self-pay

## 2020-08-30 VITALS — BP 151/111 | Ht 65.0 in | Wt 209.5 lb

## 2020-08-30 DIAGNOSIS — Z3009 Encounter for other general counseling and advice on contraception: Secondary | ICD-10-CM

## 2020-08-30 DIAGNOSIS — Z3042 Encounter for surveillance of injectable contraceptive: Secondary | ICD-10-CM

## 2020-08-30 NOTE — Progress Notes (Signed)
Consulted by RN re: patient BP and due for Depo today.  Reviewed RN note and agree that it reflects our discussion and my recommendations.

## 2020-08-30 NOTE — Progress Notes (Signed)
Pt is 13.3 weeks post depo today. Pt denies headache, blurred vision, dizziness, denies smoking. Nothing to eat or drink this morning. Last time she took BP med was yesterday, unsure of time; pt states she has supply of BP meds, last time she saw Dr about BP was virtual visit maybe 2 months ago. Consulted with provider regarding BP and pt details; per verbal order by Antoine Primas, PA, DMPA 150 mg IM administered today and pt counseled to be consistent with taking BP medication and follow-up with PCP. (For ref also Charlotte Sanes, PA order dated 12/16/19 for depo for 1 year.)

## 2020-09-06 ENCOUNTER — Other Ambulatory Visit: Payer: Self-pay

## 2020-09-06 ENCOUNTER — Telehealth (INDEPENDENT_AMBULATORY_CARE_PROVIDER_SITE_OTHER): Payer: No Typology Code available for payment source | Admitting: Psychiatry

## 2020-09-06 ENCOUNTER — Encounter: Payer: Self-pay | Admitting: Psychiatry

## 2020-09-06 DIAGNOSIS — F633 Trichotillomania: Secondary | ICD-10-CM

## 2020-09-06 DIAGNOSIS — F4312 Post-traumatic stress disorder, chronic: Secondary | ICD-10-CM

## 2020-09-06 DIAGNOSIS — F3341 Major depressive disorder, recurrent, in partial remission: Secondary | ICD-10-CM

## 2020-09-06 DIAGNOSIS — F411 Generalized anxiety disorder: Secondary | ICD-10-CM

## 2020-09-06 DIAGNOSIS — F1020 Alcohol dependence, uncomplicated: Secondary | ICD-10-CM

## 2020-09-06 MED ORDER — PAROXETINE HCL 20 MG PO TABS
20.0000 mg | ORAL_TABLET | Freq: Every day | ORAL | 1 refills | Status: AC
Start: 2020-09-06 — End: ?

## 2020-09-06 MED ORDER — TOPIRAMATE 100 MG PO TABS: 50.0000 mg | ORAL_TABLET | Freq: Two times a day (BID) | ORAL | 1 refills | Status: AC

## 2020-09-06 MED ORDER — HYDROXYZINE HCL 25 MG PO TABS: 12.5000 mg | ORAL_TABLET | Freq: Every day | ORAL | 1 refills | Status: AC | PRN

## 2020-09-06 MED ORDER — MIRTAZAPINE 15 MG PO TABS: 7.5000 mg | ORAL_TABLET | Freq: Every day | ORAL | 1 refills | Status: DC

## 2020-09-06 NOTE — Progress Notes (Signed)
Virtual Visit via Video Note  I connected with Dawn Waller on 09/06/20 at  3:00 PM EST by a video enabled telemedicine application and verified that I am speaking with the correct person using two identifiers.  Location Provider Location : [facility] Patient Location : Car  Participants: Patient , Provider   I discussed the limitations of evaluation and management by telemedicine and the availability of in person appointments. The patient expressed understanding and agreed to proceed.     I discussed the assessment and treatment plan with the patient. The patient was provided an opportunity to ask questions and all were answered. The patient agreed with the plan and demonstrated an understanding of the instructions.   The patient was advised to call back or seek an in-person evaluation if the symptoms worsen or if the condition fails to improve as anticipated.   [place] [facility] MD OP Progress Note  09/06/2020 3:29 PM Dawn Waller  MRN:  [id number]  Chief Complaint:  Chief Complaint    Follow-up     HPI: Dawn Waller is a 45 year old African-American female, employed, lives in [place], has a history of MDD, GAD, trichotillomania, chronic PTSD, alcohol use disorder, obstructive sleep apnea noncompliant on CPAP, hypertension, migraine headaches, gastroesophageal reflux disease, low back pain, fatty liver, seasonal allergies was evaluated by telemedicine today.  Patient today reports she is currently making progress on the current medication regimen with regards to her depression.  She also reports anxiety symptoms as improving.  She reports her panic attacks are less frequent now.  She had one last night and it lasted for 25 minutes.  She was able to make use of her coping techniques which her therapist taught her.  She continues to follow-up with her therapist every 2 weeks and it is going well.  She reports sleep is overall okay on the current medication regimen.  Patient  reports she continues to use alcohol, last drink was 4 days ago.  Prior to that she was drinking 1-2 drinks of wine every single day.  However since the past 4 days she has not had a drink.  She reports her therapist has provided her resources for virtual [place] meetings and she is going to start doing that soon.  She also takes Topamax which helps.  She denies side effects.  Patient reports her hair pulling has improved.  Patient denies any suicidality, homicidality or perceptual disturbances.  Patient denies any other concerns today.  Visit Diagnosis:    ICD-10-CM   1. MDD (major depressive disorder), recurrent, in partial remission (HCC)  F33.41 PARoxetine (PAXIL) 20 MG tablet    mirtazapine (REMERON) 15 MG tablet    hydrOXYzine (ATARAX/VISTARIL) 25 MG tablet  2. 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 Chronic post-traumatic stress disorder (PTSD)  F43.12   5. Alcohol use disorder, moderate, dependence (HCC)  F10.20 topiramate (TOPAMAX) 100 MG tablet    Past Psychiatric History: I have reviewed past psychiatric history from my progress note on 06/09/2020.  Past trials of Zoloft, doxepin, melatonin  Past Medical History:  Past Medical History:  Diagnosis Date  . Back pain   . Chronic headaches   . Depression   . Fatty liver   .  GERD (gastroesophageal reflux disease)   . Hypertension   . Migraine   . OSA on CPAP   . Seasonal allergies     Past Surgical History:  Procedure Laterality Date  . CHOLECYSTECTOMY     October 08, 2018    Family Psychiatric History: I have reviewed family psychiatric history from my progress note on 06/09/2020  Family History:  Family History  Problem Relation Age of Onset  . Healthy Mother    . Healthy Father   . Breast cancer Neg Hx     Social History: I have reviewed social history from my progress note on 06/09/2020 Social History   Socioeconomic History  . Marital status: Single    Spouse name: Not on file  . Number of children: Not on file  . Years of education: Not on file  . Highest education level: Not on file  Occupational History  . Not on file  Tobacco Use  . Smoking status: Never Smoker  . Smokeless tobacco: Never Used  Vaping Use  . Vaping Use: Never used  Substance and Sexual Activity  . Alcohol use: Yes    Alcohol/week: 7.0 standard drinks    Types: 7 Glasses of wine per week    Comment: wine or vodka mixed with soda  . Drug use: Never  . Sexual activity: Not Currently    Partners: Male    Birth control/protection: Injection  Other Topics Concern  . Not on file  Social History Narrative  . Not on file   Social Determinants of Health   Financial Resource Strain: Not on file  Food Insecurity: Not on file  Transportation Needs: Not on file  Physical Activity: Not on file  Stress: Not on file  Social Connections: Not on file    Allergies:  Allergies  Allergen Reactions  . Sumatriptan   . Elavil [Amitriptyline]     Metabolic Disorder Labs: No results found for: HGBA1C, MPG No results found for: PROLACTIN No results found for: CHOL, TRIG, HDL, CHOLHDL, VLDL, LDLCALC No results found for: TSH  Therapeutic Level Labs: No results found for: LITHIUM No results found for: VALPROATE No components found for:  CBMZ  Current Medications: Current Outpatient Medications  Medication Sig Dispense Refill  . albuterol (PROVENTIL HFA;VENTOLIN HFA) 108 (90 Base) MCG/ACT inhaler Inhale into the lungs.    . budesonide-formoterol (SYMBICORT) 160-4.5 MCG/ACT inhaler Inhale into the lungs. (Patient not taking: Reported on 08/30/2020)    . cetirizine (ZYRTEC) 10 MG tablet Take by mouth. (Patient not taking: Reported on 08/30/2020)    . cholecalciferol  (VITAMIN D3) 10 MCG (400 UNIT) TABS tablet Take 1,000 Units by mouth.    . cyclobenzaprine (FLEXERIL) 10 MG tablet Take 10 mg by mouth 3 (three) times daily as needed for muscle spasms. (Patient not taking: Reported on 08/30/2020)    . esomeprazole (NEXIUM) 20 MG capsule Take 20 mg by mouth daily at 12 noon. (Patient not taking: Reported on 08/30/2020)    . famotidine (PEPCID) 20 MG tablet Take 20 mg by mouth 2 (two) times daily. (Patient not taking: Reported on 08/30/2020)    . fluticasone (FLONASE) 50 MCG/ACT nasal spray Place into the nose.    . hydrOXYzine (ATARAX/VISTARIL) 25 MG tablet Take 0.5-1 tablets (12.5-25 mg total) by mouth daily as needed for anxiety. 30 tablet 1  . Melatonin 10 MG CAPS Take 10 mg by mouth.    . mirtazapine (REMERON) 15 MG tablet Take 0.5-1 tablets (7.5-15 mg total) by mouth at  bedtime. For sleep and mood 30 tablet 1  . Multiple Vitamins-Minerals (MULTIVITAMIN WITH MINERALS) tablet Take 1 tablet by mouth daily. (Patient not taking: No sig reported) 100 tablet 0  . PARoxetine (PAXIL) 20 MG tablet Take 1 tablet (20 mg total) by mouth daily. 30 tablet 1  . topiramate (TOPAMAX) 100 MG tablet Take 0.5 tablets (50 mg total) by mouth 2 (two) times daily. For alcoholism 30 tablet 1  . verapamil (CALAN) 120 MG tablet Take 120 mg by mouth 2 (two) times daily.     No current facility-administered medications for this visit.     Musculoskeletal: Strength & Muscle Tone: UTA Gait & Station: UTA Patient leans: N/A  Psychiatric Specialty Exam: Review of Systems  Psychiatric/Behavioral: The patient is nervous/anxious.   All other systems reviewed and are negative.   There were no vitals taken for this visit.There is no height or weight on file to calculate BMI.  General Appearance: Casual  Eye Contact:  Fair  Speech:  Normal Rate  Volume:  Normal  Mood:  Anxious  Affect:  Congruent  Thought Process:  Goal Directed and Descriptions of Associations: Intact  Orientation:   Full (Time, Place, and Person)  Thought Content: Logical   Suicidal Thoughts:  No  Homicidal Thoughts:  No  Memory:  Immediate;   Fair Recent;   Fair Remote;   Fair  Judgement:  Fair  Insight:  Fair  Psychomotor Activity:  Normal  Concentration:  Concentration: Fair and Attention Span: Fair  Recall:  [employer] of Knowledge: Fair  Language: Fair  Akathisia:  No  Handed:  Right  AIMS (if indicated): UTA  Assets:  Communication Skills Desire for Improvement Housing Intimacy Social Support Talents/Skills Transportation Vocational/Educational  ADL's:  Intact  Cognition: WNL  Sleep:  Fair   Screenings: GAD-7   Flowsheet Row Video Visit from 06/09/2020 in [facility]  Total GAD-7 Score 18    PHQ2-9   Flowsheet Row Video Visit from 06/09/2020 in [facility] Office Visit from 12/16/2019 in [facility]  PHQ-2 Total Score 6 6  PHQ-9 Total Score 19 15       Assessment and Plan: Dawn Waller is a 60 year old African-American female, lives with her boyfriend, employed, has a history of depression, PTSD, trichotillomania, GAD, alcohol use disorder, multiple medical problems was evaluated by telemedicine today.  Patient is biologically predisposed given her history of trauma, multiple medical problems.  Patient with psychosocial stressors of death of her father, current pandemic.  Patient is currently making progress on the current medication regimen.  Patient with alcohol abuse will benefit from [place] meetings.  Discussed plan as noted below.  Plan MDD-in partial remission Paxil 20 mg p.o. daily Mirtazapine 15 mg p.o. nightly for sleep and mood Continue CBT with Ms. Zadie Rhine.   GAD-improving Paxil 20 mg p.o. daily Mirtazapine as prescribed Hydroxyzine 12.5-25 mg p.o. daily as needed for severe anxiety attacks  Trichotillomania-improving Continue CBT Paxil will also  help.  PTSD-chronic Continue CBT as needed  Alcohol use disorder moderate-unstable We will monitor closely. Patient is motivated to start [employer]. Topamax 50 mg p.o. twice daily.  She is more compliant now.  Pending lab-TSH.  Pending EKG to monitor QTC.  Follow-up in clinic in 3 to 4 weeks or sooner if needed.  I have spent atleast 20 minutes face to face by video with patient today. More than 50 % of the time was spent for preparing  to see the patient ( e.g., review of test, records ), ordering medications and test ,psychoeducation and supportive psychotherapy and care coordination,as well as documenting clinical information in electronic health record. This note was generated in part or whole with voice recognition software. Voice recognition is usually quite accurate but there are transcription errors that can and very often do occur. I apologize for any typographical errors that were not detected and corrected.      Ursula Alert, MD 09/06/2020, 3:29 PM

## 2020-09-07 ENCOUNTER — Telehealth: Payer: Self-pay

## 2020-09-07 DIAGNOSIS — F3341 Major depressive disorder, recurrent, in partial remission: Secondary | ICD-10-CM

## 2020-09-07 DIAGNOSIS — F411 Generalized anxiety disorder: Secondary | ICD-10-CM

## 2020-09-07 DIAGNOSIS — F633 Trichotillomania: Secondary | ICD-10-CM

## 2020-09-07 MED ORDER — PAROXETINE HCL 20 MG PO TABS: 20.0000 mg | ORAL_TABLET | Freq: Every day | ORAL | 0 refills | Status: AC

## 2020-09-07 NOTE — Telephone Encounter (Signed)
can you send in a 7 -10 day supply of the paxil to [facility] . she uses mail order and it will not arrive before she runs out.

## 2020-09-07 NOTE — Telephone Encounter (Signed)
I have sent Paxil-7-day supply to [facility] request from patient.  We will have Janett Billow CMA contact patient to let her know.

## 2020-10-04 ENCOUNTER — Encounter: Payer: Self-pay | Admitting: Psychiatry

## 2020-10-04 ENCOUNTER — Telehealth (INDEPENDENT_AMBULATORY_CARE_PROVIDER_SITE_OTHER): Payer: No Typology Code available for payment source | Admitting: Psychiatry

## 2020-10-04 ENCOUNTER — Other Ambulatory Visit: Payer: Self-pay

## 2020-10-04 DIAGNOSIS — F3341 Major depressive disorder, recurrent, in partial remission: Secondary | ICD-10-CM | POA: Diagnosis not present

## 2020-10-04 DIAGNOSIS — B977 Papillomavirus as the cause of diseases classified elsewhere: Secondary | ICD-10-CM | POA: Insufficient documentation

## 2020-10-04 DIAGNOSIS — R141 Gas pain: Secondary | ICD-10-CM | POA: Insufficient documentation

## 2020-10-04 DIAGNOSIS — R062 Wheezing: Secondary | ICD-10-CM | POA: Insufficient documentation

## 2020-10-04 DIAGNOSIS — H9319 Tinnitus, unspecified ear: Secondary | ICD-10-CM | POA: Insufficient documentation

## 2020-10-04 DIAGNOSIS — R142 Eructation: Secondary | ICD-10-CM | POA: Insufficient documentation

## 2020-10-04 DIAGNOSIS — F633 Trichotillomania: Secondary | ICD-10-CM | POA: Diagnosis not present

## 2020-10-04 DIAGNOSIS — F419 Anxiety disorder, unspecified: Secondary | ICD-10-CM | POA: Insufficient documentation

## 2020-10-04 DIAGNOSIS — B351 Tinea unguium: Secondary | ICD-10-CM | POA: Insufficient documentation

## 2020-10-04 DIAGNOSIS — K649 Unspecified hemorrhoids: Secondary | ICD-10-CM | POA: Insufficient documentation

## 2020-10-04 DIAGNOSIS — R519 Headache, unspecified: Secondary | ICD-10-CM | POA: Insufficient documentation

## 2020-10-04 DIAGNOSIS — G4700 Insomnia, unspecified: Secondary | ICD-10-CM | POA: Insufficient documentation

## 2020-10-04 DIAGNOSIS — O926 Galactorrhea: Secondary | ICD-10-CM | POA: Insufficient documentation

## 2020-10-04 DIAGNOSIS — R0981 Nasal congestion: Secondary | ICD-10-CM | POA: Insufficient documentation

## 2020-10-04 DIAGNOSIS — F32A Depression, unspecified: Secondary | ICD-10-CM | POA: Insufficient documentation

## 2020-10-04 DIAGNOSIS — Z789 Other specified health status: Secondary | ICD-10-CM | POA: Insufficient documentation

## 2020-10-04 DIAGNOSIS — G8929 Other chronic pain: Secondary | ICD-10-CM | POA: Insufficient documentation

## 2020-10-04 DIAGNOSIS — Z309 Encounter for contraceptive management, unspecified: Secondary | ICD-10-CM | POA: Insufficient documentation

## 2020-10-04 DIAGNOSIS — F4312 Post-traumatic stress disorder, chronic: Secondary | ICD-10-CM | POA: Diagnosis not present

## 2020-10-04 DIAGNOSIS — R0600 Dyspnea, unspecified: Secondary | ICD-10-CM | POA: Insufficient documentation

## 2020-10-04 DIAGNOSIS — R0683 Snoring: Secondary | ICD-10-CM | POA: Insufficient documentation

## 2020-10-04 DIAGNOSIS — K219 Gastro-esophageal reflux disease without esophagitis: Secondary | ICD-10-CM | POA: Insufficient documentation

## 2020-10-04 DIAGNOSIS — K760 Fatty (change of) liver, not elsewhere classified: Secondary | ICD-10-CM | POA: Insufficient documentation

## 2020-10-04 DIAGNOSIS — J45909 Unspecified asthma, uncomplicated: Secondary | ICD-10-CM | POA: Insufficient documentation

## 2020-10-04 DIAGNOSIS — Z634 Disappearance and death of family member: Secondary | ICD-10-CM | POA: Insufficient documentation

## 2020-10-04 DIAGNOSIS — A64 Unspecified sexually transmitted disease: Secondary | ICD-10-CM | POA: Insufficient documentation

## 2020-10-04 DIAGNOSIS — F411 Generalized anxiety disorder: Secondary | ICD-10-CM | POA: Diagnosis not present

## 2020-10-04 DIAGNOSIS — R82998 Other abnormal findings in urine: Secondary | ICD-10-CM | POA: Insufficient documentation

## 2020-10-04 DIAGNOSIS — E229 Hyperfunction of pituitary gland, unspecified: Secondary | ICD-10-CM | POA: Insufficient documentation

## 2020-10-04 DIAGNOSIS — Z331 Pregnant state, incidental: Secondary | ICD-10-CM | POA: Insufficient documentation

## 2020-10-04 DIAGNOSIS — F1020 Alcohol dependence, uncomplicated: Secondary | ICD-10-CM

## 2020-10-04 DIAGNOSIS — E669 Obesity, unspecified: Secondary | ICD-10-CM | POA: Insufficient documentation

## 2020-10-04 NOTE — Progress Notes (Signed)
Virtual Visit via Video Note  I connected with [name] on 10/04/20 at  3:00 PM EST by a video enabled telemedicine application and verified that I am speaking with the correct person using two identifiers.  Location Provider Location : [facility] Patient Location : Work  Participants: Patient , Provider    I discussed the limitations of evaluation and management by telemedicine and the availability of in person appointments. The patient expressed understanding and agreed to proceed. I discussed the assessment and treatment plan with the patient. The patient was provided an opportunity to ask questions and all were answered. The patient agreed with the plan and demonstrated an understanding of the instructions.   The patient was advised to call back or seek an in-person evaluation if the symptoms worsen or if the condition fails to improve as anticipated.   [place] MD OP Progress Note  10/04/2020 3:23 PM Dawn Waller  MRN:  [id number]  Chief Complaint:  Chief Complaint    Follow-up     HPI: Dawn Waller is a 45 year old African-American female, employed, lives in [place], has a history of MDD, GAD, trichotillomania, chronic PTSD, alcohol use disorder, obstructive sleep apnea noncompliant on CPAP, hypertension, migraine headache, gastroesophageal reflux disease, low back pain, fatty liver, seasonal allergies was evaluated by telemedicine today.  Patient today reports she is currently making progress with regards to her mood. She has good days and bad days however reports she has more good days and bad days. She denies any significant depressive symptoms.  She reports anxiety symptoms are more under control.  She however continues to struggle with sleep. She reports she goes to bed at around 9:30 PM and has to wake up in the morning to go to work however she feels as though she stays awake till 3 AM most nights. She has not been taking the mirtazapine as prescribed.  She agrees to start taking it. She also has been noncompliant with CPAP.  Patient reports she has been cutting back on alcohol and currently drinks 1-2 drinks every week or so.  She is compliant on the Topamax. Denies side effects.  Patient reports her hair pulling behavior has improved.  She continues to be following up with her therapist and reports therapy sessions are beneficial.  Patient denies any suicidality, homicidality or perceptual disturbances.  Patient denies any other concerns today.  Visit Diagnosis:    ICD-10-CM   1. MDD (major depressive disorder), recurrent, in partial remission ([place])  F33.41   2. GAD (generalized anxiety disorder)  F41.1   3. 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 Chronic post-traumatic stress disorder (PTSD)  F43.12   5. Alcohol use disorder, moderate, dependence ([place])  F10.20     Past Psychiatric History: I have reviewed past psychiatric history from my progress note on 06/09/2020. Past trials of Zoloft, doxepin, melatonin  Past Medical History:  Past Medical History:  Diagnosis Date   Back pain    Chronic headaches    Depression    Fatty liver    GERD (gastroesophageal reflux disease)    Hypertension    Migraine    OSA on CPAP    Seasonal allergies     Past Surgical History:  Procedure Laterality Date   CHOLECYSTECTOMY     October 08, 2018    Family Psychiatric History: I have reviewed family psychiatric history from my progress note on 06/09/2020  Family History:  Family History  Problem Relation Age of Onset   Healthy Mother  Healthy Father    Breast cancer Neg Hx     Social History: Reviewed social history from my progress note on 06/09/2020 Social History   Socioeconomic History   Marital status: Single    Spouse name: Not on file   Number of children: Not on file   Years of education: Not on file   Highest education level: Not on file  Occupational History   Not on file  Tobacco Use   Smoking status:  Never Smoker   Smokeless tobacco: Never Used  Vaping Use   Vaping Use: Never used  Substance and Sexual Activity   Alcohol use: Yes    Alcohol/week: 7.0 standard drinks    Types: 7 Glasses of wine per week    Comment: wine or vodka mixed with soda   Drug use: Never   Sexual activity: Not Currently    Partners: Male    Birth control/protection: Injection  Other Topics Concern   Not on file  Social History Narrative   Not on file   Social Determinants of Health   Financial Resource Strain: Not on file  Food Insecurity: Not on file  Transportation Needs: Not on file  Physical Activity: Not on file  Stress: Not on file  Social Connections: Not on file    Allergies:  Allergies  Allergen Reactions   Sumatriptan    Elavil [Amitriptyline]     Metabolic Disorder Labs: No results found for: HGBA1C, MPG No results found for: PROLACTIN No results found for: CHOL, TRIG, HDL, CHOLHDL, VLDL, LDLCALC No results found for: TSH  Therapeutic Level Labs: No results found for: LITHIUM No results found for: VALPROATE No components found for:  CBMZ  Current Medications: Current Outpatient Medications  Medication Sig Dispense Refill   diphenhydrAMINE (BANOPHEN) 25 mg capsule TAKE 1 CAPSULE BY MOUTH TWO TIMES A DAY AS NEEDED     Fluticasone-Salmeterol (WIXELA INHUB) 100-50 MCG/DOSE AEPB INHALE 1 PUFF BY ORAL INHALATION TWO TIMES A DAY **REPLACES SYMBICORT INHALER**     albuterol (PROVENTIL HFA;VENTOLIN HFA) 108 (90 Base) MCG/ACT inhaler Inhale into the lungs.     budesonide-formoterol (SYMBICORT) 160-4.5 MCG/ACT inhaler Inhale into the lungs. (Patient not taking: Reported on 08/30/2020)     cetirizine (ZYRTEC) 10 MG tablet Take by mouth. (Patient not taking: Reported on 08/30/2020)     cholecalciferol (VITAMIN D3) 10 MCG (400 UNIT) TABS tablet Take 1,000 Units by mouth.     cyclobenzaprine (FLEXERIL) 10 MG tablet Take 10 mg by mouth 3 (three) times daily as needed for  muscle spasms. (Patient not taking: Reported on 08/30/2020)     esomeprazole (NEXIUM) 20 MG capsule Take 20 mg by mouth daily at 12 noon. (Patient not taking: Reported on 08/30/2020)     famotidine (PEPCID) 20 MG tablet Take 20 mg by mouth 2 (two) times daily. (Patient not taking: Reported on 08/30/2020)     fluticasone (FLONASE) 50 MCG/ACT nasal spray Place into the nose.     hydrOXYzine (ATARAX/VISTARIL) 25 MG tablet Take 0.5-1 tablets (12.5-25 mg total) by mouth daily as needed for anxiety. 30 tablet 1   Melatonin 10 MG CAPS Take 10 mg by mouth.     mirtazapine (REMERON) 15 MG tablet Take 0.5-1 tablets (7.5-15 mg total) by mouth at bedtime. For sleep and mood 30 tablet 1   Multiple Vitamins-Minerals (MULTIVITAMIN WITH MINERALS) tablet Take 1 tablet by mouth daily. (Patient not taking: No sig reported) 100 tablet 0   PARoxetine (PAXIL) 20 MG tablet Take 1  tablet (20 mg total) by mouth daily. 30 tablet 1   PARoxetine (PAXIL) 20 MG tablet Take 1 tablet (20 mg total) by mouth daily. 7 tablet 0   topiramate (TOPAMAX) 100 MG tablet Take 0.5 tablets (50 mg total) by mouth 2 (two) times daily. For alcoholism 30 tablet 1   verapamil (CALAN) 120 MG tablet Take 120 mg by mouth 2 (two) times daily.     No current facility-administered medications for this visit.     Musculoskeletal: Strength & Muscle Tone: UTA Gait & Station: UTA Patient leans: N/A  Psychiatric Specialty Exam: Review of Systems  Psychiatric/Behavioral: Positive for dysphoric mood and sleep disturbance. The patient is nervous/anxious.   All other systems reviewed and are negative.   There were no vitals taken for this visit.There is no height or weight on file to calculate BMI.  General Appearance: Casual  Eye Contact:  Fair  Speech:  Clear and Coherent  Volume:  Normal  Mood:  Anxious and Dysphoric Improving  Affect:  Appropriate  Thought Process:  Goal Directed and Descriptions of Associations: Intact  Orientation:   Full (Time, Place, and Person)  Thought Content: Logical   Suicidal Thoughts:  No  Homicidal Thoughts:  No  Memory:  Immediate;   Fair Recent;   Fair Remote;   Fair  Judgement:  Fair  Insight:  Fair  Psychomotor Activity:  Normal  Concentration:  Concentration: Fair and Attention Span: Fair  Recall:  [employer] of Knowledge: Fair  Language: Fair  Akathisia:  No  Handed:  Right  AIMS (if indicated): UTA  Assets:  Communication Skills Desire for Improvement Housing Social Support  ADL's:  Intact  Cognition: WNL  Sleep:  Poor   Screenings: GAD-7   Flowsheet Row Video Visit from 06/09/2020 in [facility]  Total GAD-7 Score 18    PHQ2-9   [place] Video Visit from 10/04/2020 in [facility] Video Visit from 06/09/2020 in [facility] Office Visit from 12/16/2019 in [facility]  PHQ-2 Total Score 2 6 6   PHQ-9 Total Score 9 19 15     Flowsheet Row Video Visit from 10/04/2020 in [facility] No Risk       Assessment and Plan: QUANIYAH BUGH is a 14 year old African-American female, lives with her boyfriend, employed, has a history of depression, PTSD, trichotillomania, GAD, alcohol use disorder, multiple medical problems was evaluated by telemedicine today. Patient is biologically predisposed given her history of trauma, multiple medical problems. Patient with psychosocial stressors of death of her father, current pandemic. Patient is currently struggling with sleep otherwise making progress. Plan as noted below.  Plan MDD in partial remission PHQ 9 today equals 9 Paxil 20 mg p.o. daily Mirtazapine 15 mg p.o. nightly for sleep and mood-encouraged compliance Continue CBT with Ms. Zadie Rhine  GAD-improving Paxil as prescribed Mirtazapine 15 mg p.o. nightly Hydroxyzine 12.5-25 mg p.o. daily as needed for severe  anxiety attacks  Trichotillomania-improving Continue CBT Paxil as prescribed  PTSD-chronic Continue CBT as needed Continue mirtazapine for sleep. Encourage compliance Patient also has CPAP for OSA-unknown if compliant.  Alcohol use disorder moderate-improving Topamax 50 mg p.o. twice daily Patient was advised to start [place] meeting last visit.  Pending labs-TSH. Pending EKG.   Follow-up in clinic in 4 weeks or sooner if needed.  I have spent atleast 20 minutes face to face by video with patient today. More than 50 %  of the time was spent for preparing to see the patient ( e.g., review of test, records ), ordering medications and test ,psychoeducation and supportive psychotherapy and care coordination,as well as documenting clinical information in electronic health record. This note was generated in part or whole with voice recognition software. Voice recognition is usually quite accurate but there are transcription errors that can and very often do occur. I apologize for any typographical errors that were not detected and corrected.       Ursula Alert, MD 10/05/2020, 8:29 AM

## 2020-10-18 ENCOUNTER — Ambulatory Visit: Payer: No Typology Code available for payment source | Admitting: Licensed Clinical Social Worker

## 2020-10-18 ENCOUNTER — Other Ambulatory Visit: Payer: Self-pay

## 2020-10-20 ENCOUNTER — Other Ambulatory Visit: Payer: Self-pay

## 2020-10-20 ENCOUNTER — Encounter: Payer: Self-pay | Admitting: Psychiatry

## 2020-10-20 ENCOUNTER — Telehealth (INDEPENDENT_AMBULATORY_CARE_PROVIDER_SITE_OTHER): Payer: No Typology Code available for payment source | Admitting: Psychiatry

## 2020-10-20 ENCOUNTER — Telehealth: Payer: Self-pay

## 2020-10-20 DIAGNOSIS — E119 Type 2 diabetes mellitus without complications: Secondary | ICD-10-CM | POA: Insufficient documentation

## 2020-10-20 DIAGNOSIS — F633 Trichotillomania: Secondary | ICD-10-CM | POA: Diagnosis not present

## 2020-10-20 DIAGNOSIS — F1020 Alcohol dependence, uncomplicated: Secondary | ICD-10-CM

## 2020-10-20 DIAGNOSIS — F331 Major depressive disorder, recurrent, moderate: Secondary | ICD-10-CM | POA: Diagnosis not present

## 2020-10-20 DIAGNOSIS — Z9189 Other specified personal risk factors, not elsewhere classified: Secondary | ICD-10-CM

## 2020-10-20 DIAGNOSIS — F411 Generalized anxiety disorder: Secondary | ICD-10-CM

## 2020-10-20 DIAGNOSIS — F4310 Post-traumatic stress disorder, unspecified: Secondary | ICD-10-CM

## 2020-10-20 DIAGNOSIS — F4312 Post-traumatic stress disorder, chronic: Secondary | ICD-10-CM

## 2020-10-20 MED ORDER — RAMELTEON 8 MG PO TABS: 8.0000 mg | ORAL_TABLET | Freq: Every day | ORAL | 0 refills | Status: AC

## 2020-10-20 NOTE — Progress Notes (Signed)
Virtual Visit via Video Note  I connected with Dawn Waller on 10/20/20 at  3:30 PM EDT by a video enabled telemedicine application and verified that I am speaking with the correct person using two identifiers.  Location Provider Location : [facility] Patient Location : Work  Participants: Patient , Provider   I discussed the limitations of evaluation and management by telemedicine and the availability of in person appointments. The patient expressed understanding and agreed to proceed.   I discussed the assessment and treatment plan with the patient. The patient was provided an opportunity to ask questions and all were answered. The patient agreed with the plan and demonstrated an understanding of the instructions.   The patient was advised to call back or seek an in-person evaluation if the symptoms worsen or if the condition fails to improve as anticipated.   [facility] MD OP Progress Note  10/20/2020 3:59 PM Dawn Waller  MRN:  [id number]  Chief Complaint:  Chief Complaint    Follow-up; Depression     HPI: Dawn Waller is a 45 year old African-American female, employed, lives in [place], has a history of MDD, GAD, trichotillomania, chronic PTSD, alcohol use disorder, obstructive sleep apnea noncompliant on CPAP, hypertension, migraine headaches, gastroesophageal reflux disease, low back pain, fatty liver, seasonal allergies was evaluated by telemedicine today.  Patient today reports she is currently struggling with anxiety and sadness.  She was tearful in session today.  Patient reports she was diagnosed with diabetes type 2 recently.  She reports she developed increased urination, fatigue and thirst and was evaluated by her provider.  Patient is currently on insulin.  She reports she is also referred to a nutritionist.  She is trying to manage her diet.  Patient however reports she is thinking a lot about her dad who passed away and also her own health problems.  She  reports this worries her.  She has been anxious and sad and has had crying spells the past few days.  She reports sleep is restless.  She reports she continues to drink alcohol on a regular basis and since the weekend has had 4 drinks at least.  She reports she is aware that she needs to cut back or quit.  Patient has not been able to schedule an appointment with AA.  She however is motivated to do so.  She continues to want to stay in therapy and agrees to call her therapist for a follow-up soon.  Patient denies any suicidality, homicidality or perceptual disturbances.  Patient denies any other concerns today.  Visit Diagnosis:    ICD-10-CM   1. MDD (major depressive disorder), recurrent episode, moderate ([facility])  F33.1 ramelteon (ROZEREM) 8 MG tablet  2. GAD (generalized anxiety disorder)  F41.1   3. 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 PTSD (post-traumatic stress disorder)  F43.10   5. Alcohol use disorder, moderate, dependence ([facility])  F10.20   6. At risk for prolonged QT interval syndrome  Z91.89 EKG 12-Lead    Past Psychiatric History: I have reviewed past psychiatric history from my progress note on 06/09/2020.  Past trials of Zoloft, doxepin, melatonin  Past Medical History:  Past Medical History:  Diagnosis Date  . Back pain   . Chronic headaches   . Depression   . Fatty liver   . GERD (gastroesophageal reflux disease)   . Hypertension   . Migraine   . OSA on CPAP   . Seasonal allergies     Past Surgical History:  Procedure  Laterality Date  . CHOLECYSTECTOMY     October 08, 2018    Family Psychiatric History: Reviewed family psychiatric history from my progress note on 06/09/2020  Family History:  Family History  Problem Relation Age of Onset  . Healthy Mother   . Healthy Father   . Breast cancer Neg Hx     Social History: Reviewed social history from my progress note on 06/09/2020 Social History   Socioeconomic History  . Marital status: Single    Spouse name: Not  on file  . Number of children: Not on file  . Years of education: Not on file  . Highest education level: Not on file  Occupational History  . Not on file  Tobacco Use  . Smoking status: Never Smoker  . Smokeless tobacco: Never Used  Vaping Use  . Vaping Use: Never used  Substance and Sexual Activity  . Alcohol use: Yes    Alcohol/week: 7.0 standard drinks    Types: 7 Glasses of wine per week    Comment: wine or vodka mixed with soda  . Drug use: Never  . Sexual activity: Not Currently    Partners: Male    Birth control/protection: Injection  Other Topics Concern  . Not on file  Social History Narrative  . Not on file   Social Determinants of Health   Financial Resource Strain: Not on file  Food Insecurity: Not on file  Transportation Needs: Not on file  Physical Activity: Not on file  Stress: Not on file  Social Connections: Not on file    Allergies:  Allergies  Allergen Reactions  . Sumatriptan   . Elavil [Amitriptyline]   . Metformin Diarrhea    Metabolic Disorder Labs: No results found for: HGBA1C, MPG No results found for: PROLACTIN No results found for: CHOL, TRIG, HDL, CHOLHDL, VLDL, LDLCALC No results found for: TSH  Therapeutic Level Labs: No results found for: LITHIUM No results found for: VALPROATE No components found for:  CBMZ  Current Medications: Current Outpatient Medications  Medication Sig Dispense Refill  . dextrose (GLUTOSE) 40 % GEL TAKE 1 TUBE (15 GRAMS OF GLUCOSE) BY MOUTH EVERY 15 MINUTES AS NEEDED FOR LOW BLOOD SUGAR    . fluconazole (DIFLUCAN) 150 MG tablet Take one tab now orally and 1 tab in 72 hrs if needed    . hydrochlorothiazide (HYDRODIURIL) 25 MG tablet Take 1 tablet by mouth daily.    Marland Kitchen ibuprofen (ADVIL) 400 MG tablet TAKE ONE TABLET BY MOUTH EVERY 8 HOURS AS NEEDED FOR PAIN AND INFLAMMATION TAKE WITH FOODFOR MUSCLE PAIN    . insulin aspart (NOVOLOG) 100 UNIT/ML injection INJECT PER SLIDING SCALE UNDER SKIN ONCE EVERY DAY  FOR DIABETES.DISCARD BOTTLE 28 DAYS AFTER OPENING  IF BLOOD SUGAR IS BETWEEN 150 - 199 GIVE 4 UNITS, 200 - 249 6 UNITS, 250-299 8  UNITS, 300-349 10 UNITS, 350-399 12 UNITS. OVER [place].DISCARD BOTTLE 28 DAYS AFTER OPENING  IF BLOOD SUGAR IS BETWEEN 150 - 199 GIVE 4 UNITS, 200 - 249 6 UNITS, 250-299 8  UNITS, 300-349 10 UNITS, 350-399 12 UNITS. OVER 400 CALL CLINIC    . medroxyPROGESTERone (DEPO-PROVERA) 150 MG/ML injection INJECT 150MG /1ML INTRAMUSCULARLY EVERY 3 MONTHS    . metFORMIN (GLUCOPHAGE) 1000 MG tablet TAKE ONE TABLET BY MOUTH TWICE DAILY WITH MORNING AND EVENING MEALS FOR DIABETES    . metroNIDAZOLE (METROGEL) 0.75 % vaginal gel Place vaginally.    . potassium chloride SA (KLOR-CON) 20 MEQ tablet TAKE  ONE-HALF TABLET BY MOUTH ONCE EVERY DAY FOR POTASSIUM REPLACEMENT. DO NOT CRUSH OR CHEW. TABLET MAY BE DISSOLVED IN 2 OZ OF WATER FOR EASE OF ADMINISTRATION.    . ramelteon (ROZEREM) 8 MG tablet Take 1 tablet (8 mg total) by mouth at bedtime. 30 tablet 0  . albuterol (PROVENTIL HFA;VENTOLIN HFA) 108 (90 Base) MCG/ACT inhaler Inhale into the lungs.    . budesonide-formoterol (SYMBICORT) 160-4.5 MCG/ACT inhaler Inhale into the lungs. (Patient not taking: Reported on 08/30/2020)    . cetirizine (ZYRTEC) 10 MG tablet Take by mouth. (Patient not taking: Reported on 08/30/2020)    . cholecalciferol (VITAMIN D3) 10 MCG (400 UNIT) TABS tablet Take 1,000 Units by mouth.    . cyclobenzaprine (FLEXERIL) 10 MG tablet Take 10 mg by mouth 3 (three) times daily as needed for muscle spasms. (Patient not taking: Reported on 08/30/2020)    . diphenhydrAMINE (BANOPHEN) 25 mg capsule TAKE 1 CAPSULE BY MOUTH TWO TIMES A DAY AS NEEDED    . doxycycline (VIBRAMYCIN) 100 MG capsule Take 100 mg by mouth 2 (two) times daily.    Marland Kitchen esomeprazole (NEXIUM) 20 MG capsule Take 20 mg by mouth daily at 12 noon. (Patient not taking: Reported on 08/30/2020)    . famotidine (PEPCID) 20 MG tablet Take 20 mg by  mouth 2 (two) times daily. (Patient not taking: Reported on 08/30/2020)    . fluconazole (DIFLUCAN) 150 MG tablet Take 150 mg by mouth 2 (two) times daily as needed.    . fluticasone (FLONASE) 50 MCG/ACT nasal spray Place into the nose.    Marland Kitchen Fluticasone-Salmeterol (WIXELA INHUB) 100-50 MCG/DOSE AEPB INHALE 1 PUFF BY ORAL INHALATION TWO TIMES A DAY **REPLACES SYMBICORT INHALER**    . hydrOXYzine (ATARAX/VISTARIL) 25 MG tablet Take 0.5-1 tablets (12.5-25 mg total) by mouth daily as needed for anxiety. 30 tablet 1  . Melatonin 10 MG CAPS Take 10 mg by mouth.    . metroNIDAZOLE (METROGEL) 0.75 % vaginal gel Place vaginally at bedtime.    . Multiple Vitamins-Minerals (MULTIVITAMIN WITH MINERALS) tablet Take 1 tablet by mouth daily. (Patient not taking: No sig reported) 100 tablet 0  . PARoxetine (PAXIL) 20 MG tablet Take 1 tablet (20 mg total) by mouth daily. 30 tablet 1  . PARoxetine (PAXIL) 20 MG tablet Take 1 tablet (20 mg total) by mouth daily. 7 tablet 0  . topiramate (TOPAMAX) 100 MG tablet Take 0.5 tablets (50 mg total) by mouth 2 (two) times daily. For alcoholism 30 tablet 1  . verapamil (CALAN) 120 MG tablet Take 120 mg by mouth 2 (two) times daily.     No current facility-administered medications for this visit.     Musculoskeletal: Strength & Muscle Tone: UTA Gait & Station: UTA Patient leans: N/A  Psychiatric Specialty Exam: Review of Systems  Constitutional: Positive for fatigue.  Psychiatric/Behavioral: Positive for dysphoric mood and sleep disturbance. The patient is nervous/anxious.   All other systems reviewed and are negative.   There were no vitals taken for this visit.There is no height or weight on file to calculate BMI.  General Appearance: Casual  Eye Contact:  Fair  Speech:  Clear and Coherent  Volume:  Normal  Mood:  Anxious and Depressed  Affect:  Tearful  Thought Process:  Goal Directed and Descriptions of Associations: Intact  Orientation:  Full (Time, Place,  and Person)  Thought Content: Logical   Suicidal Thoughts:  No  Homicidal Thoughts:  No  Memory:  Immediate;   Fair Recent;  Fair Remote;   Fair  Judgement:  Fair  Insight:  Fair  Psychomotor Activity:  Normal  Concentration:  Concentration: Fair and Attention Span: Fair  Recall:  [employer] of Knowledge: Fair  Language: Fair  Akathisia:  No  Handed:  Right  AIMS (if indicated): UTA  Assets:  Communication Skills Desire for Improvement Housing Social Support  ADL's:  Intact  Cognition: WNL  Sleep:  Poor   Screenings: GAD-7   Flowsheet Row Video Visit from 06/09/2020 in [facility]  Total GAD-7 Score 18    PHQ2-9   [place] Video Visit from 10/04/2020 in [facility] Video Visit from 06/09/2020 in [facility] Office Visit from 12/16/2019 in [facility] Department  PHQ-2 Total Score 2 6 6   PHQ-9 Total Score 9 19 15     Flowsheet Row Video Visit from 10/04/2020 in [facility]  C-SSRS RISK CATEGORY No Risk       Assessment and Plan: Dawn Waller is a 79 year old African-American female, lives with her boyfriend, employed, has a history of depression, PTSD, trichotillomania, GAD, alcohol use disorder, multiple medical problems was evaluated by telemedicine today.  Patient is biologically predisposed given her history of trauma, multiple health problems.  Patient with psychosocial stressors of death of her father, recent diagnosis of diabetes.  Patient is currently struggling with mood and will benefit from the following plan.  Plan MDD-unstable Continue Paxil 20 mg p.o. daily Discontinue mirtazapine due to recent diagnosis of diabetes. Start Rozerem 8 mg p.o. nightly Continue CBT with Ms. Zadie Rhine  GAD-unstable Paxil as prescribed Patient advised to have more frequent therapy sessions. Continue hydroxyzine 12.5-25 mg p.o. daily as  needed for severe anxiety attacks  PTSD-unstable Start Rozerem 8 mg po whs  Will continue CBT as needed Patient does have CPAP available for obstructive sleep apnea.  Continue to encourage compliance.  Alcohol use disorder-unstable Topamax 50 mg p.o. twice daily Patient encouraged to start [place] meetings  At risk for prolonged QT syndrome/high risk medication use-will order EKG.  Patient encouraged to get it done.  Patient also encouraged to get TSH labs done.  She will follow-up with her primary care provider on the same.  I have coordinated care with therapist Ms. Zadie Rhine.  Patient does see her therapist sooner since she is currently struggling.  Follow-up in clinic in 2 to 3 weeks or sooner if needed.  I have spent atleast 30 minutes with patient today which includes the time spent for preparing to see the patient ( e.g., review of test, records ), obtaining and to review and separately obtained history , ordering medications and test ,psychoeducation and supportive psychotherapy and care coordination,as well as documenting clinical information in electronic health record,interpreting and communication of test results This note was generated in part or whole with voice recognition software. Voice recognition is usually quite accurate but there are transcription errors that can and very often do occur. I apologize for any typographical errors that were not detected and corrected.      Ursula Alert, MD 10/21/2020, 11:04 AM

## 2020-10-20 NOTE — Telephone Encounter (Signed)
left message that a ekg was ordered and order was sent to [facility].  Pt was told that she would need to call and make an appt and she needed to call [phone]

## 2020-10-20 NOTE — Patient Instructions (Signed)
Ramelteon Oral Tablets What is this medicine? RAMELTEON (ram EL tee on) is used to treat insomnia (trouble sleeping). This medicine helps you to fall asleep. This medicine may be used for other purposes; ask your health care provider or pharmacist if you have questions. COMMON BRAND NAME(S): Rozerem What should I tell my health care provider before I take this medicine? They need to know if you have any of these conditions:  history of alcohol or medicine abuse or addiction  liver disease  lung or breathing disease (asthma, COPD)  mental health disease  sleep apnea  suicidal thoughts, plans or attempt  an unusual or allergic reaction to ramelteon, other medicines, foods, dyes, or preservatives  pregnant or trying to get pregnant  breast-feeding How should I use this medicine? Take this medicine by mouth with water. Take it as directed on the prescription label and only when you are ready for bed. Do not cut or break this medicine. Swallow the tablets whole. Do not take it with or right after a meal. A special MedGuide will be given to you by the pharmacist with each prescription and refill. Be sure to read this information carefully each time. Talk to your health care provider about the use of this medicine in children. It is not approved for use in children. Overdosage: If you think you have taken too much of this medicine contact a poison control center or emergency room at once. NOTE: This medicine is only for you. Do not share this medicine with others. What if I miss a dose? This does not apply. This medicine should only be taken immediately before going to sleep. Do not take double or extra doses. What may interact with this medicine? Do not take this medicine with any of the following medications:  fluvoxamine  melatonin  tasimelteon  viloxazine This medicine may also interact with the following medications:  alcohol  certain medicines for fungal infections like  ketoconazole, fluconazole, or itraconazole  ciprofloxacin  donepezil  doxepin  other medicines for sleep  rifampin This list may not describe all possible interactions. Give your health care provider a list of all the medicines, herbs, non-prescription drugs, or dietary supplements you use. Also tell them if you smoke, drink alcohol, or use illegal drugs. Some items may interact with your medicine. What should I watch for while using this medicine? Visit your health care provider for regular checks on your progress. Tell your health care provider if your symptoms do not start to get better or if they get worse. Do not take this medicine unless you are able to stay in bed for a full night (7 to 8 hours) before you must be active again. Do not stand or sit up quickly after taking this medicine, especially if you are an older patient. This reduces the risk of dizzy or fainting spells. You may still feel drowsy the next day after taking this medicine. Do not drive or do other dangerous activities until you feel fully awake. After taking this medicine, you may get up out of bed and do an activity that you do not know you are doing. The next morning, you may have no memory of this. Activities include driving a car ("sleep-driving"), making and eating food, talking on the phone, sexual activity, and sleep-walking. Serious injuries have occurred. Stop the medicine and call your doctor right away if you find out you have done any of these activities. Do not take this medicine if you have used alcohol that   evening. Do not take it if you have taken another medicine for sleep. The risk of doing these sleep-related activities is higher. If you or your family notice any changes in your behavior, such as new or worsening depression, thoughts of harming yourself, anxiety, other unusual or disturbing thoughts, or memory loss, call your health care provider right away. After you stop taking this medicine, you may  have trouble falling asleep. This is called rebound insomnia. This problem usually goes away on its own after 1 or 2 nights. What side effects may I notice from receiving this medicine? Side effects that you should report to your doctor or health care professional as soon as possible:  allergic reactions (skin rash, itching or hives; swelling of the face, lips, or tongue)  abnormal production of milk  changes in sex drive or performance  hallucinations  missed monthly period (for women)  trouble breathing  unusual activities while not fully awake such as driving, eating, making phone calls, or sexual activity  vomiting Side effects that usually do not require medical attention (report to your doctor or health care professional if they continue or are bothersome):  dizziness  drowsiness  headache  unusual dreams or nightmares  tiredness This list may not describe all possible side effects. Call your doctor for medical advice about side effects. You may report side effects to FDA at 1-800-FDA-1088. Where should I keep my medicine? Keep out of the reach of children and pets. Store at room temperature between 15 and 30 degrees C (59 and 86 degrees F). Protect from light and moisture. Keep the container tightly closed. Get rid of any unused medicine after the expiration date. To get rid of medicines that are no longer needed or have expired:  Take the medicine to a medicine take-back program. Check with your pharmacy or law enforcement to find a location.  If you cannot return the medicine, check the label or package insert to see if the medicine should be thrown out in the garbage or flushed down the toilet. If you are not sure, ask your health care provider. If it is safe to put it in the trash, take the medicine out of the container. Mix the medicine with cat litter, dirt, coffee grounds, or other unwanted substance. Seal the mixture in a bag or container. Put it in the  trash. NOTE: This sheet is a summary. It may not cover all possible information. If you have questions about this medicine, talk to your doctor, pharmacist, or health care provider.  2021 Elsevier/Gold Standard (2019-11-20 15:31:46)  

## 2020-10-20 NOTE — Telephone Encounter (Signed)
orders for ekg was faxed and confirmed to [facility]

## 2020-10-21 NOTE — Telephone Encounter (Signed)
received a notice that the [facility] will not [facility] the ramelteon. only medcation they will provide is the trazodone, mirtazapine, and zolpidem

## 2020-10-22 NOTE — Telephone Encounter (Signed)
Attempted to contact patient to discuss other options.  Left voicemail.

## 2020-10-29 ENCOUNTER — Ambulatory Visit
Admission: RE | Admit: 2020-10-29 | Discharge: 2020-10-29 | Disposition: A | Discharge status: Home / Self Care | Payer: Self-pay | Source: Ambulatory Visit | Attending: Psychiatry | Admitting: Psychiatry

## 2020-10-29 ENCOUNTER — Other Ambulatory Visit: Payer: Self-pay

## 2020-10-29 DIAGNOSIS — Z9189 Other specified personal risk factors, not elsewhere classified: Secondary | ICD-10-CM | POA: Insufficient documentation

## 2020-11-02 ENCOUNTER — Ambulatory Visit (INDEPENDENT_AMBULATORY_CARE_PROVIDER_SITE_OTHER): Payer: No Typology Code available for payment source | Admitting: Licensed Clinical Social Worker

## 2020-11-02 ENCOUNTER — Encounter: Payer: Self-pay | Admitting: Licensed Clinical Social Worker

## 2020-11-02 ENCOUNTER — Other Ambulatory Visit: Payer: Self-pay

## 2020-11-02 DIAGNOSIS — F4310 Post-traumatic stress disorder, unspecified: Secondary | ICD-10-CM

## 2020-11-02 DIAGNOSIS — F331 Major depressive disorder, recurrent, moderate: Secondary | ICD-10-CM | POA: Diagnosis not present

## 2020-11-02 DIAGNOSIS — F1020 Alcohol dependence, uncomplicated: Secondary | ICD-10-CM | POA: Diagnosis not present

## 2020-11-02 DIAGNOSIS — F411 Generalized anxiety disorder: Secondary | ICD-10-CM

## 2020-11-02 NOTE — Progress Notes (Signed)
Virtual Visit via Video Note  I connected with [name] on 11/02/20 at  3:00 PM EDT by a video enabled telemedicine application and verified that I am speaking with the correct person using two identifiers.  Participating Parties Patient Provider  Location: Patient: Worksite Provider: Home Office   I discussed the limitations of evaluation and management by telemedicine and the availability of in person appointments. The patient expressed understanding and agreed to proceed.  THERAPY PROGRESS NOTE  Session Time: 64 Minutes  Participation Level: Active  Behavioral Response: Well GroomedAlertDepressed and Tearful  Type of Therapy: Individual Therapy  Treatment Goals addressed: Coping  Interventions: CBT  Summary: Dawn Waller is a 45 y.o. female who presents with depression sxs. Pt reported current feeling "okay" and continues to work full-time. Pt reported she plans to make steps toward positive changes in April. Pt acknowledged "I know I keep saying that. I start and then experience setbacks. The last month has been a real struggle". Pt reported that she was recently diagnosed with Type 2 Diabetes and continues to drink alcohol. Pt reported she copes by "pushing through" and telling herself "even though I have small setbacks, I can make an even bigger comeback" and is motivated to make her father proud.   Suicidal/Homicidal: No  Therapist Response: Therapist met with patient for follow up. Therapist and patient explored sxs and attempts to cope. Therapist validated patient feelings/concerns. Therapist and patient discussed attending AA and grief support groups. Therapist informed patient regarding initial phase of terminating services with this therapist due to leaving the practice in the next 4 weeks. Therapist and patient discussed options for continued care.  Plan: Return again in 2 weeks.  Diagnosis: Axis I: Alcohol Abuse, Generalized Anxiety Disorder, Post  Traumatic Stress Disorder and MDD, Recurrent, Moderate    Axis II: N/A  Josephine Igo, LCSW, LCAS 11/02/2020

## 2020-11-03 ENCOUNTER — Telehealth: Payer: Self-pay | Admitting: Psychiatry

## 2020-11-03 NOTE — Telephone Encounter (Signed)
Returned call to patient.  Left voicemail since patient did not pick up.  I been trying to reach patient since the past 2 weeks.  Each time I call it goes into a voicemail.

## 2020-11-04 ENCOUNTER — Telehealth: Payer: Self-pay

## 2020-11-04 NOTE — Telephone Encounter (Signed)
Returned call to patient.  She reports she had an EKG done.  However I am not able to view it in the system due to technical error.  She reports the EKG may have showed some abnormality however she is not sure.  Advised patient to contact her primary care provider for further recommendations for EKG changes.  Discussed with patient Ambien may not be the right medication for her since she has a history of alcoholism.  Advised her to continue the melatonin for now.  However advised her to sign a release so we can obtain medical records from her primary care provider regarding her recent cardiology issues so that we can coordinate care and make medication readjustment as needed.  Patient voiced understanding.

## 2020-11-04 NOTE — Telephone Encounter (Signed)
pt called she is returning your call.

## 2020-11-05 NOTE — Telephone Encounter (Signed)
Received

## 2020-11-12 ENCOUNTER — Telehealth (INDEPENDENT_AMBULATORY_CARE_PROVIDER_SITE_OTHER): Payer: No Typology Code available for payment source | Admitting: Psychiatry

## 2020-11-12 ENCOUNTER — Other Ambulatory Visit: Payer: Self-pay

## 2020-11-12 ENCOUNTER — Telehealth: Payer: No Typology Code available for payment source | Admitting: Psychiatry

## 2020-11-12 DIAGNOSIS — F633 Trichotillomania: Secondary | ICD-10-CM

## 2020-11-12 DIAGNOSIS — F331 Major depressive disorder, recurrent, moderate: Secondary | ICD-10-CM

## 2020-11-12 DIAGNOSIS — F1020 Alcohol dependence, uncomplicated: Secondary | ICD-10-CM

## 2020-11-12 DIAGNOSIS — Z9189 Other specified personal risk factors, not elsewhere classified: Secondary | ICD-10-CM

## 2020-11-12 DIAGNOSIS — F4310 Post-traumatic stress disorder, unspecified: Secondary | ICD-10-CM

## 2020-11-12 DIAGNOSIS — F411 Generalized anxiety disorder: Secondary | ICD-10-CM

## 2020-11-12 NOTE — Progress Notes (Signed)
Error this appointment had to be cancelled .

## 2020-11-18 ENCOUNTER — Ambulatory Visit (INDEPENDENT_AMBULATORY_CARE_PROVIDER_SITE_OTHER): Payer: No Typology Code available for payment source | Admitting: Licensed Clinical Social Worker

## 2020-11-18 ENCOUNTER — Other Ambulatory Visit: Payer: Self-pay

## 2020-11-18 ENCOUNTER — Encounter: Payer: Self-pay | Admitting: Licensed Clinical Social Worker

## 2020-11-18 DIAGNOSIS — F331 Major depressive disorder, recurrent, moderate: Secondary | ICD-10-CM | POA: Diagnosis not present

## 2020-11-18 DIAGNOSIS — F1010 Alcohol abuse, uncomplicated: Secondary | ICD-10-CM | POA: Diagnosis not present

## 2020-11-18 DIAGNOSIS — F4310 Post-traumatic stress disorder, unspecified: Secondary | ICD-10-CM

## 2020-11-18 DIAGNOSIS — F411 Generalized anxiety disorder: Secondary | ICD-10-CM | POA: Diagnosis not present

## 2020-11-18 NOTE — Progress Notes (Signed)
Virtual Visit via Video Note  I connected with Dawn Waller on 11/18/20 at  3:00 PM EDT by a video enabled telemedicine application and verified that I am speaking with the correct person using two identifiers.  Participating Parties Patient Provider  Location: Patient: Vehicle at [employer] [address]  Provider: Home Office   I discussed the limitations of evaluation and management by telemedicine and the availability of in person appointments. The patient expressed understanding and agreed to proceed.  THERAPY PROGRESS NOTE  Session Time: 42 Minutes  Participation Level: Active  Behavioral Response: Well GroomedAlertDepressed and Tearful  Type of Therapy: Individual Therapy  Treatment Goals addressed: Coping  Interventions: CBT  Summary: Dawn Waller is a 45 y.o. female who presents with depression sxs. Pt reported she went to a family function last Saturday and didn't know her mom was going to be there. Pt reported she wanted to leave and go home, but didn't. Pt explained that she had been estranged from her mother from a young age. Pt reported she is always the one reaching out to try to forge a relationship with her mother, however it is very one-sided. Pt reported "I hadn't seen her in over one year. She never called, never came to my house. I had to call around to even get in touch with her to see how she was when my Dad died". Pt identified what she would like to tell her mother and expressed the emotional pain she carries from feeling abandoned and unwanted by her parents. Pt reported "I am grateful" that she was able to reconnect with her father and had a relationship for 4 years before he died. Pt reported "I just want God to be pleased with me. I am trying to honor my mother, but I can't force someone to change". Pt reported her mother also has a problem with alcohol. Pt reported she is "getting better" with her own alcohol use. Pt s/ "if I drink it is maybe  only one glass and not every day. I try to find other things to drink, like soda". Pt reported she reviewed materials therapist has sent about AA meetings and grief support groups. Pt reported she plans to attend a grief support group with a local church next week in-person. Pt reported this weekend she will be going to the beach and looking to "unwind". Pt continues to use journaling and poetry to express her emotions and recognized "I keep a lot in".   Suicidal/Homicidal: No  Therapist Response: Therapist met with patient for follow up. Therapist and patient processed thoughts, feelings and reactions to current stressors. Therapist and patient discussed relationship dynamics with parents as a child and adult. Therapist validated patient feelings/concerns. Therapist provided psychoeducation around attachments and parent-child role reversal. Pt was receptive.  Plan: Return again as needed to resume therapy with new therapist.  Diagnosis: Axis I: Alcohol Abuse, Generalized Anxiety Disorder, Post Traumatic Stress Disorder and MDD, Recurrent, Moderate    Axis II: N/A  Josephine Igo, LCSW, LCAS 11/18/2020

## 2020-11-29 ENCOUNTER — Ambulatory Visit (LOCAL_COMMUNITY_HEALTH_CENTER): Payer: Self-pay

## 2020-11-29 VITALS — BP 145/106 | Ht 65.0 in | Wt 192.5 lb

## 2020-11-29 DIAGNOSIS — Z3042 Encounter for surveillance of injectable contraceptive: Secondary | ICD-10-CM

## 2020-11-29 DIAGNOSIS — Z3009 Encounter for other general counseling and advice on contraception: Secondary | ICD-10-CM

## 2020-11-29 MED ORDER — MEDROXYPROGESTERONE ACETATE 150 MG/ML IM SUSP
150.0000 mg | Freq: Once | INTRAMUSCULAR | Status: AC
  Administered 2020-11-29: 150 mg via INTRAMUSCULAR

## 2020-11-29 NOTE — Progress Notes (Addendum)
13 weeks post depo. Elevated BP today 145/106. Denies chest pain, headache, SOB, vision changes. Reports nausea that she relates to not eating today, but plans to eat after appt. Has PCP and on BP meds but has not taken today. Unsure when next PCP f-u. Consult Ola Spurr, CNM gives ok for Depo today and recommends RN to discuss risks of HBP, such as "silent killer" and need to schedule PCP appt to f-u with BP. RN carried out provider orders. Pt agrees to contact PCP.  Depo given R delt and tolerated well. Physical due when next depo is due, approx 02/14/2021, pt aware. Josie Saunders, RN  Consulted on the plan of care for this client.  I agree with the documented note and actions taken to provide care for this client.  Ola Spurr, CNM

## 2021-01-06 ENCOUNTER — Telehealth: Payer: No Typology Code available for payment source | Admitting: Psychiatry

## 2021-02-22 ENCOUNTER — Ambulatory Visit (LOCAL_COMMUNITY_HEALTH_CENTER): Payer: Self-pay | Admitting: Advanced Practice Midwife

## 2021-02-22 ENCOUNTER — Other Ambulatory Visit: Payer: Self-pay

## 2021-02-22 VITALS — BP 123/84 | Ht 65.0 in | Wt 191.2 lb

## 2021-02-22 DIAGNOSIS — Z3009 Encounter for other general counseling and advice on contraception: Secondary | ICD-10-CM

## 2021-02-22 DIAGNOSIS — F1020 Alcohol dependence, uncomplicated: Secondary | ICD-10-CM

## 2021-02-22 DIAGNOSIS — Z3042 Encounter for surveillance of injectable contraceptive: Secondary | ICD-10-CM

## 2021-02-22 MED ORDER — MEDROXYPROGESTERONE ACETATE 150 MG/ML IM SUSP
150.0000 mg | INTRAMUSCULAR | Status: AC
Start: 2021-02-22 — End: 2022-02-10
  Administered 2021-02-22 – 2022-02-10 (×4): 150 mg via INTRAMUSCULAR

## 2021-02-22 NOTE — Progress Notes (Addendum)
Here today for a PE and Depo. Last PE here was 12/16/2019, last Pap Smear was 11/06/2019 (NIL, HPV-.) Last breast exam 2020. Last depo was 11/29/2020 (12.1 weeks.) Declines all STD testing today. Hal Morales, RN

## 2021-02-22 NOTE — Progress Notes (Signed)
[facility] Number: [phone]    Family Planning Visit- Initial Visit  Subjective:  Dawn Waller is a 45 y.o. SBF [id number] nonsmoker being seen today for an initial annual visit and to discuss contraceptive options.  The patient is currently using Depo Provera for pregnancy prevention. Patient reports she does not want a pregnancy in the next year.  Patient has the following medical conditions has Adjustment disorder with mixed anxiety and depressed mood; PTSD (post-traumatic stress disorder); Cervical high risk HPV (human papillomavirus) test positive; Fibroids; Hypertension; MDD (major depressive disorder), recurrent episode, moderate ([place]); GAD (generalized anxiety disorder); Alcohol use disorder, mild, abuse; Trichotillomania; Alcohol use disorder, moderate, dependence ([place]); At risk for prolonged QT interval syndrome; Wheezing; Tinnitus; Snoring; Asthma; Sexually transmitted disease; Pregnant state, incidental; Other and unspecified anterior pituitary hyperfunction; Onychomycosis; Obesity; Nasal congestion; Insomnia; Human papilloma virus infection; Hemorrhoids; Gastroesophageal reflux disease; Galactorrhea associated with childbirth; Flatulence, eructation and gas pain; Fatty liver; Dyspnea; Depression; Daily headache; Crystalluria; Contraceptive management; Chronic pain; Chronic low back pain; Bereavement; Anxiety; Admits to alcohol consumption; and Type 2 diabetes mellitus ([place]) on their problem list.  Chief Complaint  Patient presents with  . Gynecologic Exam  . Contraception    Patient reports last DMPA 11/29/20. Last PE 12/16/19. Last pap 11/06/19 neg HPV neg. LMP not on DMPA. Last sex 02/12/21 with condom; with current partner x 5 years; 1 partner in last 3 mo. Last ETOH last night (2 glasses wine) daily.  Patient denies cigs, vaping, cigars, MJ  Body mass index is 31.82 kg/m. - Patient is eligible  for diabetes screening based on BMI and age >84?  not applicable XL2G ordered? no  Patient reports 1  partner/s in last year. Desires STI screening?  No - pt refuses  Has patient been screened once for HCV in the past?  No  No results found for: HCVAB  Does the patient have current drug use (including MJ), have a partner with drug use, and/or has been incarcerated since last result? No  If yes-- Screen for HCV through [facility]   Does the patient meet criteria for HBV testing? No  Criteria:  -Household, sexual or needle sharing contact with HBV -History of drug use -HIV positive -Those with known Hep C   Health Maintenance Due  Topic Date Due  . HEMOGLOBIN A1C  Never done  . PNEUMOCOCCAL POLYSACCHARIDE VACCINE AGE 21-64 HIGH RISK  Never done  . COVID-19 Vaccine (1) Never done  . Pneumococcal Vaccine 59-43 Years old (1 - PCV) Never done  . FOOT EXAM  Never done  . OPHTHALMOLOGY EXAM  Never done  . URINE MICROALBUMIN  Never done  . Hepatitis C Screening  Never done    Review of Systems  All other systems reviewed and are negative.  The following portions of the patient's history were reviewed and updated as appropriate: allergies, current medications, past family history, past medical history, past social history, past surgical history and problem list. Problem list updated.   See flowsheet for other program required questions.  Objective:   Vitals:   02/22/21 1521  BP: 123/84  Weight: 191 lb 3.2 oz (86.7 kg)  Height: 5\' 5"  (1.651 m)    Physical Exam Constitutional:      Appearance: Normal appearance. She is obese.  HENT:     Head: Normocephalic and atraumatic.     Mouth/Throat:     Mouth: Mucous membranes are moist.  Comments: Last dental exam 2021; encouraged dental exam soon Eyes:     Conjunctiva/sclera: Conjunctivae normal.  Neck:     Thyroid: No thyroid mass, thyromegaly or thyroid tenderness.  Cardiovascular:     Rate and Rhythm: Normal rate and  regular rhythm.  Pulmonary:     Effort: Pulmonary effort is normal.     Breath sounds: Normal breath sounds.  Chest:  Breasts:    Right: Normal.     Left: Normal.  Abdominal:     Palpations: Abdomen is soft.     Comments: Soft without masses or tenderness, poor tone  Genitourinary:    Rectum: Normal.     Comments: Pt refuses bimanual exam; in monogomous 5 year relationship Musculoskeletal:        General: Normal range of motion.     Cervical back: Normal range of motion and neck supple.  Skin:    General: Skin is warm and dry.  Neurological:     Mental Status: She is alert.  Psychiatric:        Mood and Affect: Mood normal.      Assessment and Plan:  Dawn Waller is a 44 y.o. female presenting to the [facility] Department for an initial annual wellness/contraceptive visit  Contraception counseling: Reviewed all forms of birth control options in the tiered based approach. available including abstinence; over the counter/barrier methods; hormonal contraceptive medication including pill, patch, ring, injection,contraceptive implant, ECP; hormonal and nonhormonal IUDs; permanent sterilization options including vasectomy and the various tubal sterilization modalities. Risks, benefits, and typical effectiveness rates were reviewed.  Questions were answered.  Written information was also given to the patient to review.  Patient desires DMPA continuation, this was prescribed for patient. She will follow up in  11=13 wks for surveillance.  She was told to call with any further questions, or with any concerns about this method of contraception.  Emphasized use of condoms 100% of the time for STI prevention.  Patient was offered ECP. ECP was not accepted by the patient. ECP counseling was not given - see RN documentation  1. Family planning DMPA 150 mg IM q 11-13 wks x 1 year - medroxyPROGESTERone (DEPO-PROVERA) injection 150 mg  2. Encounter for surveillance of  injectable contraceptive   3. Alcohol use disorder, moderate, dependence (HCC) Daily ETOH. Pt declines ETOH cessation assistance     Return in about 11 weeks (around 05/10/2021) for Depo, 11-13 wk DMPA.  No future appointments.  Herbie Saxon, CNM

## 2021-02-22 NOTE — Progress Notes (Signed)
Depo given and tolerated well. Hal Morales, RN

## 2021-03-22 ENCOUNTER — Telehealth: Payer: Self-pay | Admitting: Psychiatry

## 2021-03-22 NOTE — Telephone Encounter (Signed)
Patient called in to see if she should resume services back with [facility]. Spoke with Dr. Modesta Messing regarding this patient. She will not be able to take the patient. Patient will need to be referred out.

## 2021-07-12 ENCOUNTER — Other Ambulatory Visit: Payer: Self-pay

## 2021-07-12 ENCOUNTER — Ambulatory Visit (LOCAL_COMMUNITY_HEALTH_CENTER): Payer: Self-pay | Admitting: Family Medicine

## 2021-07-12 VITALS — BP 128/89 | Ht 65.5 in | Wt 192.6 lb

## 2021-07-12 DIAGNOSIS — Z3042 Encounter for surveillance of injectable contraceptive: Secondary | ICD-10-CM

## 2021-07-12 DIAGNOSIS — Z30013 Encounter for initial prescription of injectable contraceptive: Secondary | ICD-10-CM

## 2021-07-12 DIAGNOSIS — Z3009 Encounter for other general counseling and advice on contraception: Secondary | ICD-10-CM

## 2021-07-12 NOTE — Progress Notes (Signed)
Discussed with patient importance of keeping depo every 11-13 weeks for most effectiveness.     Pt verbalized understanding.   See RN note for DMPA administration today.    Junious Dresser, FNP

## 2021-07-12 NOTE — Progress Notes (Signed)
Pt here for Depo injection.  Last sex: 02/12/2021.  Last Depo:  02/22/2021 (20 wk 0 d). Depo 150 mg given IM without any complications.  Pt given reminder card to return in 11-13 weeks for next Depo injections.  Condoms declined by pt. Windle Guard, RN

## 2021-10-19 ENCOUNTER — Ambulatory Visit (LOCAL_COMMUNITY_HEALTH_CENTER): Payer: Self-pay

## 2021-10-19 ENCOUNTER — Other Ambulatory Visit: Payer: Self-pay

## 2021-10-19 VITALS — BP 134/89 | Ht 65.5 in | Wt 196.0 lb

## 2021-10-19 DIAGNOSIS — Z3042 Encounter for surveillance of injectable contraceptive: Secondary | ICD-10-CM

## 2021-10-19 DIAGNOSIS — Z3009 Encounter for other general counseling and advice on contraception: Secondary | ICD-10-CM

## 2021-10-19 NOTE — Progress Notes (Signed)
14 weeks 1 day post depo. Voices no complaints. RN counseled pt on adhering to 11 -13 week interval between depo injections for optimal benefit. Pt in agreement. Depo given today per order by Ola Spurr, CNM dated 02/22/2021. Tolerated well R delt. Next depo due 01/04/2022, has reminder. Josie Saunders, RN ? ?

## 2022-02-01 ENCOUNTER — Ambulatory Visit: Payer: Self-pay

## 2022-02-09 ENCOUNTER — Ambulatory Visit: Payer: Self-pay

## 2022-02-10 ENCOUNTER — Ambulatory Visit (LOCAL_COMMUNITY_HEALTH_CENTER): Payer: Self-pay

## 2022-02-10 VITALS — BP 140/96 | Ht 65.0 in | Wt 191.5 lb

## 2022-02-10 DIAGNOSIS — Z3042 Encounter for surveillance of injectable contraceptive: Secondary | ICD-10-CM

## 2022-02-10 DIAGNOSIS — Z3009 Encounter for other general counseling and advice on contraception: Secondary | ICD-10-CM

## 2022-02-10 NOTE — Progress Notes (Addendum)
16 weeks 2 days post Depo.  Voices no concerns.  B/P 140/96.  Patient said she did not take BP med today and has been running around all day. Consult with Ola Spurr CNM regarding b/p of 140/96.  Patient provided blood pressure obtained today.  Encouraged fluids and to take medications as prescribed to control blood pressure. Depo given in left deltoid per order dated 02/22/2021 by E. Sciora,CNM. Tolerated well. Next Depo due 04/28/22.  Reminder card provided and patient told exam was due prior to next depo.  Counseled to adhere to 11-13 week interval between depo.    Consulted on the plan of care for this client.  I agree with the documented note and actions taken to provide care for this client.  Ola Spurr, CNM

## 2022-05-16 ENCOUNTER — Ambulatory Visit (LOCAL_COMMUNITY_HEALTH_CENTER): Payer: Self-pay | Admitting: Family Medicine

## 2022-05-16 VITALS — BP 143/101 | Ht 65.0 in | Wt 184.0 lb

## 2022-05-16 DIAGNOSIS — Z3042 Encounter for surveillance of injectable contraceptive: Secondary | ICD-10-CM

## 2022-05-16 DIAGNOSIS — N898 Other specified noninflammatory disorders of vagina: Secondary | ICD-10-CM

## 2022-05-16 DIAGNOSIS — Z309 Encounter for contraceptive management, unspecified: Secondary | ICD-10-CM

## 2022-05-16 DIAGNOSIS — Z113 Encounter for screening for infections with a predominantly sexual mode of transmission: Secondary | ICD-10-CM

## 2022-05-16 LAB — WET PREP FOR TRICH, YEAST, CLUE
Trichomonas Exam: NEGATIVE
Yeast Exam: NEGATIVE

## 2022-05-16 MED ORDER — MEDROXYPROGESTERONE ACETATE 150 MG/ML IM SUSP
150.0000 mg | INTRAMUSCULAR | Status: AC
  Administered 2022-05-16 – 2022-12-01 (×3): 150 mg via INTRAMUSCULAR

## 2022-05-16 MED ORDER — FLUCONAZOLE 150 MG PO TABS: 150.0000 mg | ORAL_TABLET | ORAL | 0 refills | Status: AC

## 2022-05-16 NOTE — Progress Notes (Signed)
[facility] [place] Number: [phone]  Family Planning Visit- Acute visit  Subjective:  Dawn Waller is a 46 y.o. G0P0000  being seen today for an annual wellness visit and to discuss contraception options.   The patient is currently using Hormonal Injection for pregnancy prevention. Patient does not want a pregnancy in the next year.    Patient has the following medical problems: has Adjustment disorder with mixed anxiety and depressed mood; PTSD (post-traumatic stress disorder); Cervical high risk HPV (human papillomavirus) test positive; Fibroids; Hypertension; MDD (major depressive disorder), recurrent episode, moderate ([place]); GAD (generalized anxiety disorder); Alcohol use disorder, mild, abuse; Trichotillomania; Alcohol use disorder, moderate, dependence ([place]); At risk for prolonged QT interval syndrome; Wheezing; Tinnitus; Snoring; Asthma; Sexually transmitted disease; Pregnant state, incidental; Other and unspecified anterior pituitary hyperfunction; Onychomycosis; Obesity; Nasal congestion; Insomnia; Human papilloma virus infection; Hemorrhoids; Gastroesophageal reflux disease; Galactorrhea associated with childbirth; Flatulence, eructation and gas pain; Fatty liver; Dyspnea; Depression; Daily headache; Crystalluria; Contraceptive management; Chronic pain; Chronic low back pain; Bereavement; Anxiety; Admits to alcohol consumption; and Type 2 diabetes mellitus ([place]) on their problem list.  Chief Complaint  Patient presents with   Contraception    Depo shot and screening patient complained of vaginal irritation for the past two weeks     Patient reports vaginal irritation for several weeks.   Patient denies changes in medical condition.    See flowsheet for other program required questions.   Body mass index is 30.62 kg/m. - Patient is eligible for diabetes screening based on BMI and age >66?  yes HA1C  ordered? No- patient did not want blood work today  Patient reports 1 of partners in last year. Desires STI screening?  Yes   Has patient been screened once for HCV in the past?  No  No results found for: "HCVAB"  Does the patient have current of drug use, have a partner with drug use, and/or has been incarcerated since last result? No  If yes-- Screen for HCV through [facility]   Does the patient meet criteria for HBV testing? No  Criteria:  -Household, sexual or needle sharing contact with HBV -History of drug use -HIV positive -Those with known Hep C   Health Maintenance Due  Topic Date Due   HEMOGLOBIN A1C  Never done   COVID-19 Vaccine (1) Never done   FOOT EXAM  Never done   OPHTHALMOLOGY EXAM  Never done   Diabetic kidney evaluation - GFR measurement  Never done   Diabetic kidney evaluation - Urine ACR  Never done   Hepatitis C Screening  Never done   INFLUENZA VACCINE  03/07/2022    Review of Systems  Constitutional:  Negative for chills and fever.  Eyes:  Negative for blurred vision and double vision.  Respiratory:  Negative for cough and shortness of breath.   Cardiovascular:  Negative for chest pain and orthopnea.  Gastrointestinal:  Negative for nausea and vomiting.  Genitourinary:  Negative for dysuria, flank pain and frequency.  Musculoskeletal:  Negative for myalgias.  Skin:  Negative for rash.  Neurological:  Negative for dizziness, tingling, weakness and headaches.  Endo/Heme/Allergies:  Does not bruise/bleed easily.  Psychiatric/Behavioral:  Negative for depression and suicidal ideas. The patient is not nervous/anxious.     The following portions of the patient's history were reviewed and updated as appropriate: allergies, current medications, past family history, past medical history, past social history, past surgical history  and problem list. Problem list updated.  Objective:   Vitals:   05/16/22 1621  BP: (!) 143/101  Weight: 184 lb (83.5  kg)  Height: '5\' 5"'$  (1.651 m)    Physical Exam Exam conducted with a chaperone present.  Constitutional:      Appearance: Normal appearance.  HENT:     Head: Normocephalic and atraumatic.  Pulmonary:     Effort: Pulmonary effort is normal.  Abdominal:     Palpations: Abdomen is soft.  Genitourinary:    Exam position: Lithotomy position.     Pubic Area: No rash or pubic lice.      Labia:        Right: Rash present.        Left: Rash present.      Vagina: Vaginal discharge (white/cottage cheese like discharge) present.     Cervix: Normal.     Uterus: Normal.      Comments: See image-- Has whitening of labia that might be candidal Musculoskeletal:        General: Normal range of motion.  Skin:    General: Skin is warm and dry.  Neurological:     General: No focal deficit present.     Mental Status: She is alert.  Psychiatric:        Mood and Affect: Mood normal.        Behavior: Behavior normal.        Assessment and Plan:  Dawn Waller is a 46 y.o. female G0P0000 presenting to the [facility] Department for an yearly wellness and contraception visit   Contraception counseling: Reviewed options based on patient desire and reproductive life plan. Patient is interested in Hormonal Injection. This was provided to the patient today.  Risks, benefits, and typical effectiveness rates were reviewed.  Questions were answered.  Written information was also given to the patient to review.    The patient will follow up in  3 months for surveillance.  The patient was told to call with any further questions, or with any concerns about this method of contraception.  Emphasized use of condoms 100% of the time for STI prevention.  1. Vaginal itching Will try diflucan Recommend GYN follow up if not resolving Might need vulvar bx if continued sx - WET PREP FOR TRICH, YEAST, CLUE - fluconazole (DIFLUCAN) 150 MG tablet; Take 1 tablet (150 mg total) by mouth every 3  (three) days. For two doses  Dispense: 2 tablet; Refill: 0  2. Screening examination for venereal disease  3. Encounter for surveillance of injectable contraceptive - medroxyPROGESTERone (DEPO-PROVERA) injection 150 mg  4. Well woman - pap is UTD - recommended mammogram- patient had [place] services. Reviewed [facility] - reviewed establishing PCP care, given list   Return in about 11 weeks (around 08/01/2022) for Depo.  No future appointments.  Caren Macadam, MD

## 2022-05-16 NOTE — Progress Notes (Signed)
Pt to be seen today for PE and STD testing, however before visit started pt had a panic attack. PE was cancelled and pt switched to an Acute visit, so she could go home sooner. Pt self-swabbed for wet prep after receiving instructions from RN on how to do so correctly. Pt concerned for possible yeast infection due to burning and itching sensation vaginally. Lab results showed no yeast or BV. MD Ernestina Patches assessed pt. Treated for yeast per exam. Depo given. Pt tolerated well.

## 2022-05-23 ENCOUNTER — Encounter: Payer: Self-pay | Admitting: Family Medicine

## 2022-08-31 ENCOUNTER — Ambulatory Visit (LOCAL_COMMUNITY_HEALTH_CENTER): Payer: Self-pay

## 2022-08-31 VITALS — BP 138/98 | Ht 65.0 in | Wt 177.5 lb

## 2022-08-31 DIAGNOSIS — Z3009 Encounter for other general counseling and advice on contraception: Secondary | ICD-10-CM

## 2022-08-31 DIAGNOSIS — Z3042 Encounter for surveillance of injectable contraceptive: Secondary | ICD-10-CM

## 2022-08-31 NOTE — Progress Notes (Signed)
15 weeks and 2 days post hormonal injection.  Discussed with patient about appointments recommended between 11 and 13 weeks. B/P obtained by machine was 148/98 and 138/98 manually.  Patient reported taking blood pressure medication as prescribed and last PCP visit was in 06/2022.  Consult with Sharlet Salina, FNP regarding B/Ps.  Patient needs to report B/Ps obtained to PCP and may need to be seen. B/Ps obtained were provided to patient in writing to report to her PCP.  Medroxyprogesterone Acetate 150 mg given per order dated 05/16/2022 for 1 year by K.Ernestina Patches, MD.  Given in right deltoid. Tolerated well. Reminder card provided to call for next appointment due 11/16/2022,

## 2022-11-06 ENCOUNTER — Ambulatory Visit
Admission: EM | Admit: 2022-11-06 | Discharge: 2022-11-06 | Disposition: A | Discharge status: Home / Self Care | Payer: No Typology Code available for payment source | Attending: Family Medicine | Admitting: Family Medicine

## 2022-11-06 DIAGNOSIS — R739 Hyperglycemia, unspecified: Secondary | ICD-10-CM | POA: Diagnosis not present

## 2022-11-06 DIAGNOSIS — R251 Tremor, unspecified: Secondary | ICD-10-CM | POA: Diagnosis not present

## 2022-11-06 DIAGNOSIS — I10 Essential (primary) hypertension: Secondary | ICD-10-CM | POA: Insufficient documentation

## 2022-11-06 DIAGNOSIS — R519 Headache, unspecified: Secondary | ICD-10-CM | POA: Diagnosis present

## 2022-11-06 DIAGNOSIS — E876 Hypokalemia: Secondary | ICD-10-CM | POA: Insufficient documentation

## 2022-11-06 DIAGNOSIS — R7401 Elevation of levels of liver transaminase levels: Secondary | ICD-10-CM | POA: Diagnosis present

## 2022-11-06 DIAGNOSIS — R42 Dizziness and giddiness: Secondary | ICD-10-CM | POA: Insufficient documentation

## 2022-11-06 LAB — URINALYSIS, W/ REFLEX TO CULTURE (INFECTION SUSPECTED)
Bilirubin Urine: NEGATIVE
Glucose, UA: NEGATIVE mg/dL
Ketones, ur: NEGATIVE mg/dL
Leukocytes,Ua: NEGATIVE
Nitrite: NEGATIVE
Specific Gravity, Urine: 1.025 (ref 1.005–1.030)
WBC, UA: NONE SEEN WBC/hpf (ref 0–5)
pH: 6 (ref 5.0–8.0)

## 2022-11-06 LAB — COMPREHENSIVE METABOLIC PANEL
ALT: 138 U/L — ABNORMAL HIGH (ref 0–44)
AST: 127 U/L — ABNORMAL HIGH (ref 15–41)
Albumin: 4.3 g/dL (ref 3.5–5.0)
Alkaline Phosphatase: 55 U/L (ref 38–126)
Anion gap: 13 (ref 5–15)
BUN: 8 mg/dL (ref 6–20)
CO2: 24 mmol/L (ref 22–32)
Calcium: 9 mg/dL (ref 8.9–10.3)
Chloride: 97 mmol/L — ABNORMAL LOW (ref 98–111)
Creatinine, Ser: 0.86 mg/dL (ref 0.44–1.00)
GFR, Estimated: >60 mL/min (ref >60)
Glucose, Bld: 155 mg/dL — ABNORMAL HIGH (ref 70–99)
Potassium: 3.3 mmol/L — ABNORMAL LOW (ref 3.5–5.1)
Sodium: 134 mmol/L — ABNORMAL LOW (ref 135–145)
Total Bilirubin: 0.5 mg/dL (ref 0.3–1.2)
Total Protein: 8.3 g/dL — ABNORMAL HIGH (ref 6.5–8.1)

## 2022-11-06 LAB — CBC WITH DIFFERENTIAL/PLATELET
Abs Immature Granulocytes: 0.03 10*3/uL (ref 0.00–0.07)
Basophils Absolute: 0 10*3/uL (ref 0.0–0.1)
Basophils Relative: 0 %
Eosinophils Absolute: 0.1 10*3/uL (ref 0.0–0.5)
Eosinophils Relative: 1 %
HCT: 39.9 % (ref 36.0–46.0)
Hemoglobin: 14.1 g/dL (ref 12.0–15.0)
Immature Granulocytes: 0 %
Lymphocytes Relative: 23 %
Lymphs Abs: 1.8 10*3/uL (ref 0.7–4.0)
MCH: 32.6 pg (ref 26.0–34.0)
MCHC: 35.3 g/dL (ref 30.0–36.0)
MCV: 92.4 fL (ref 80.0–100.0)
Monocytes Absolute: 0.4 10*3/uL (ref 0.1–1.0)
Monocytes Relative: 6 %
Neutro Abs: 5.6 10*3/uL (ref 1.7–7.7)
Neutrophils Relative %: 70 %
Platelets: 276 10*3/uL (ref 150–400)
RBC: 4.32 MIL/uL (ref 3.87–5.11)
RDW: 15.4 % (ref 11.5–15.5)
WBC: 8 10*3/uL (ref 4.0–10.5)
nRBC: 0 % (ref 0.0–0.2)

## 2022-11-06 LAB — GLUCOSE, CAPILLARY: Glucose-Capillary: 164 mg/dL — ABNORMAL HIGH (ref 70–99)

## 2022-11-06 LAB — TROPONIN I (HIGH SENSITIVITY): Troponin I (High Sensitivity): 3 ng/L (ref <18)

## 2022-11-06 MED ORDER — KETOROLAC TROMETHAMINE 30 MG/ML IJ SOLN
30.0000 mg | Freq: Once | INTRAMUSCULAR | Status: AC
Start: 2022-11-06 — End: 2022-11-06
  Administered 2022-11-06: 30 mg via INTRAMUSCULAR

## 2022-11-06 MED ORDER — NAPROXEN 500 MG PO TABS: 500.0000 mg | ORAL_TABLET | Freq: Two times a day (BID) | ORAL | 0 refills | Status: AC

## 2022-11-06 NOTE — ED Triage Notes (Addendum)
Pt c/o lightheadedness,HA,fatigue & R arm tingling since today, is T2 diabetic. Has checked her BS today around 1 & was 197. States she is concerned about having another episode of DKA since sx's are similar.

## 2022-11-06 NOTE — Discharge Instructions (Addendum)
For your headache, tremor and dizziness: You are given a Toradol injection here.  I sent a medication to your pharmacy.  If your headache gets worse, go to the emergency department.  Follow-up with neurology at the [facility].  You will likely need new advanced head imaging that we are not able to perform here at the urgent care.  The electrical tracing of your heart was similar to  previous tracings and  your heart enzyme was normal.  For your low potassium: Take your potassium supplements at home.  Take 1 tablet today and 2 tablets tomorrow.   For your blood sugar: You do not have diabetic ketoacidosis/DKA.  Blood sugar is elevated here but not terribly so.  Continue taking both your long and short acting insulins as previously directed by your primary care provider.  For your blood pressure: Your blood pressure was elevated here, it may be due to pain.  Be sure to take your blood pressure daily.  Your liver enzymes are elevated here in the setting of known fatty liver disease.  Be sure to see reschedule your FibroScan.

## 2022-11-06 NOTE — ED Provider Notes (Signed)
[facility]    CSN: [id number] Arrival date & time: 11/06/22  1729      History   Chief Complaint Chief Complaint  Patient presents with   Dizziness   Tingling   Fatigue   Shaking    HPI Dawn Waller is a 47 y.o. female.   HPI   Dawn Waller presents for lightheadedness, vomited once, nausea which is similar to when she had DKA previously. Check her blood sugar around 1 PM that was 197 then gave herself insulin and ate lunch. Gave herself 4 units.  She uses insulin regularly and hasn't missed any doses. Feels "off."  No fever, cough, sore throat, chills, chest pain, shortness of breath, abdominal pain, rhinorrhea, nasal congestion.   Has back pain for the past 2 weeks.  She is having a hard time writing for the past 3 weeks.  Feels like her arm is trembling.  Has been spotting. She is due for Depot soon. No LMP recorded.    Past Medical History:  Diagnosis Date   Asthma    Back pain    Chronic headaches    Depression    Diabetes mellitus without complication    Fatty liver    GERD (gastroesophageal reflux disease)    Hypertension    Migraine    OSA on CPAP    Seasonal allergies    Vaginal Pap smear, abnormal     Patient Active Problem List   Diagnosis Date Noted   Type 2 diabetes mellitus 10/20/2020   Wheezing 10/04/2020   Tinnitus 10/04/2020   Snoring 10/04/2020   Asthma 10/04/2020   Sexually transmitted disease 10/04/2020   Pregnant state, incidental 10/04/2020   Other and unspecified anterior pituitary hyperfunction 10/04/2020   Onychomycosis 10/04/2020   Obesity 10/04/2020   Nasal congestion 10/04/2020   Insomnia 10/04/2020   Human papilloma virus infection 10/04/2020   Hemorrhoids 10/04/2020   Gastroesophageal reflux disease 10/04/2020   Galactorrhea associated with childbirth 10/04/2020   Flatulence, eructation and gas pain 10/04/2020   Fatty liver 10/04/2020   Dyspnea 10/04/2020   Depression 10/04/2020   Daily headache  10/04/2020   Crystalluria 10/04/2020   Contraceptive management 10/04/2020   Chronic pain 10/04/2020   Chronic low back pain 10/04/2020   Bereavement 10/04/2020   Anxiety 10/04/2020   Admits to alcohol consumption 10/04/2020   Trichotillomania 06/09/2020   Alcohol use disorder, moderate, dependence 06/09/2020   At risk for prolonged QT interval syndrome 06/09/2020   MDD (major depressive disorder), recurrent episode, moderate 01/20/2020   GAD (generalized anxiety disorder) 01/20/2020   Alcohol use disorder, mild, abuse 01/20/2020   Hypertension 12/16/2019   Cervical high risk HPV (human papillomavirus) test positive 11/06/2019   Fibroids 09/12/2017   Adjustment disorder with mixed anxiety and depressed mood 03/02/2015   PTSD (post-traumatic stress disorder) 04/30/2014    Past Surgical History:  Procedure Laterality Date   CHOLECYSTECTOMY     October 08, 2018    OB History     Gravida  0   Para  0   Term  0   Preterm  0   AB  0   Living  0      SAB  0   IAB  0   Ectopic  0   Multiple  0   Live Births  0            Home Medications    Prior to Admission medications   Medication Sig Start Date End Date Taking?  Authorizing Provider  naproxen (NAPROSYN) 500 MG tablet Take 1 tablet (500 mg total) by mouth 2 (two) times daily with a meal. 11/06/22  Yes Jamirra Curnow, DO  albuterol (PROVENTIL HFA;VENTOLIN HFA) 108 (90 Base) MCG/ACT inhaler Inhale into the lungs.    [provider]  budesonide-formoterol (SYMBICORT) 160-4.5 MCG/ACT inhaler Inhale into the lungs. Patient not taking: Reported on 08/30/2020    [provider]  cetirizine (ZYRTEC) 10 MG tablet Take by mouth. 11/26/19   [provider]  cholecalciferol (VITAMIN D3) 10 MCG (400 UNIT) TABS tablet Take 1,000 Units by mouth.    [provider]  cyclobenzaprine (FLEXERIL) 10 MG tablet Take 10 mg by mouth 3 (three) times daily as needed for muscle spasms.    [provider]  dextrose (GLUTOSE) 40 % GEL TAKE 1 TUBE (15 GRAMS OF GLUCOSE) BY MOUTH EVERY 15 MINUTES AS NEEDED FOR LOW BLOOD SUGAR 10/08/20   [provider]  diphenhydrAMINE (BANOPHEN) 25 mg capsule TAKE 1 CAPSULE BY MOUTH TWO TIMES A DAY AS NEEDED 11/28/19 11/28/20  [provider]  doxycycline (VIBRAMYCIN) 100 MG capsule Take 100 mg by mouth 2 (two) times daily. Patient not taking: Reported on 05/16/2022 10/06/20   [provider]  esomeprazole (NEXIUM) 20 MG capsule Take 20 mg by mouth daily at 12 noon.    [provider]  famotidine (PEPCID) 20 MG tablet Take 20 mg by mouth 2 (two) times daily.    [provider]  fluconazole (DIFLUCAN) 150 MG tablet Take 1 tablet (150 mg total) by mouth every 3 (three) days. For two doses 05/16/22   Caren Macadam, MD  fluticasone [facility]) 50 MCG/ACT nasal spray Place into the nose. 11/26/19   [provider]  Fluticasone-Salmeterol (WIXELA INHUB) 100-50 MCG/DOSE AEPB INHALE 1 PUFF BY ORAL INHALATION TWO TIMES A DAY **REPLACES SYMBICORT INHALER** 03/09/20 03/10/21  [provider]  hydrochlorothiazide (HYDRODIURIL) 25 MG tablet Take 1 tablet by mouth daily. 10/08/20   [provider]  hydrOXYzine (ATARAX/VISTARIL) 25 MG tablet Take 0.5-1 tablets (12.5-25 mg total) by mouth daily as needed for anxiety. Patient not taking: Reported on 05/16/2022 09/06/20   Ursula Alert, MD  insulin aspart (NOVOLOG) 100 UNIT/ML injection INJECT PER SLIDING SCALE UNDER SKIN ONCE EVERY DAY FOR DIABETES.DISCARD BOTTLE 28 DAYS AFTER OPENING  IF BLOOD SUGAR IS BETWEEN 150 - 199 GIVE 4 UNITS, 200 - 249 6 UNITS, [phone] 8  UNITS, 300-349 10 UNITS, 350-399 12 UNITS. OVER [place].DISCARD BOTTLE 28 DAYS AFTER OPENING  IF BLOOD SUGAR IS BETWEEN 150 - 199 GIVE 4 UNITS, 200 - 249 6 UNITS, 250-299 8  UNITS, 300-349 10 UNITS, 350-399 12 UNITS. [place] Patient not taking: Reported on  05/16/2022 10/08/20   [provider]  insulin glargine (LANTUS) 100 UNIT/ML injection INJECT 10 UNITS UNDER SKIN EVERY EVENING AT 9PM FOR DIABETES MELLITUS DO NOT MIX WITH OTHER INSULINS IN THE SAME SYRINGE. DISCARD BOTTLE 28 DAYS AFTER OPENING 10/08/20 05/16/22  [provider]  medroxyPROGESTERone (DEPO-PROVERA) 150 MG/ML injection INJECT 150MG /1ML INTRAMUSCULARLY EVERY 3 MONTHS 10/18/20   [provider]  Melatonin 10 MG CAPS Take 10 mg by mouth. Patient not taking: Reported on 05/16/2022    [provider]  metFORMIN (GLUCOPHAGE) 1000 MG tablet TAKE ONE TABLET BY MOUTH TWICE DAILY WITH MORNING AND EVENING MEALS FOR DIABETES 10/08/20 11/06/20  [provider]  metroNIDAZOLE (METROGEL) 0.75 % vaginal gel Place vaginally. 10/05/20  [provider]  metroNIDAZOLE (METROGEL) 0.75 % vaginal gel Place vaginally at bedtime. 10/06/20   [provider]  Multiple Vitamins-Minerals (MULTIVITAMIN WITH MINERALS) tablet Take 1 tablet by mouth daily. Patient not taking: Reported on 06/09/2020 03/17/19   Jerene Dilling, PA  PARoxetine (PAXIL) 20 MG tablet Take 1 tablet (20 mg total) by mouth daily. 09/06/20   Ursula Alert, MD  PARoxetine (PAXIL) 20 MG tablet Take 1 tablet (20 mg total) by mouth daily. 09/07/20   Ursula Alert, MD  potassium chloride SA (KLOR-CON) 20 MEQ tablet TAKE ONE-HALF TABLET BY MOUTH ONCE EVERY DAY FOR POTASSIUM REPLACEMENT. DO NOT CRUSH OR CHEW. TABLET MAY BE DISSOLVED IN 2 OZ OF WATER FOR EASE OF ADMINISTRATION. 10/08/20   [provider]  ramelteon (ROZEREM) 8 MG tablet Take 1 tablet (8 mg total) by mouth at bedtime. Patient not taking: Reported on 05/16/2022 10/20/20   Ursula Alert, MD  topiramate (TOPAMAX) 100 MG tablet Take 0.5 tablets (50 mg total) by mouth 2 (two) times daily. For alcoholism Patient not taking: Reported on 05/16/2022 09/06/20   Ursula Alert, MD  verapamil (CALAN) 120 MG tablet Take 120 mg by mouth 2  (two) times daily.    [provider]    Family History Family History  Problem Relation Age of Onset   Healthy Mother    Healthy Father    Breast cancer Neg Hx     Social History Social History   Tobacco Use   Smoking status: Never   Smokeless tobacco: Never  Vaping Use   Vaping Use: Never used  Substance Use Topics   Alcohol use: Yes    Alcohol/week: 7.0 standard drinks of alcohol    Types: 7 Glasses of wine per week    Comment: daily   Drug use: Never     Allergies   Sumatriptan, Elavil [amitriptyline], and Metformin   Review of Systems Review of Systems: negative unless otherwise stated in HPI.      Physical Exam Triage Vital Signs ED Triage Vitals  Enc Vitals Group     BP 11/06/22 1815 (!) 156/107     Pulse Rate 11/06/22 1815 96     Resp 11/06/22 1815 16     Temp 11/06/22 1815 98.5 F (36.9 C)     Temp Source 11/06/22 1815 Oral     SpO2 11/06/22 1815 96 %     Weight 11/06/22 1814 170 lb (77.1 kg)     Height 11/06/22 1814 5\' 4"  (1.626 m)     Head Circumference --      Peak Flow --      Pain Score 11/06/22 1813 10     Pain Loc --      Pain Edu? --      Excl. in [place]? --    No data found.  Updated Vital Signs BP (!) 159/111 (BP Location: Right Arm)   Pulse 98   Temp 98.5 F (36.9 C) (Oral)   Resp 16   Ht 5\' 4"  (1.626 m)   Wt 77.1 kg   SpO2 96%   BMI 29.18 kg/m   Visual Acuity Right Eye Distance:   Left Eye Distance:   Bilateral Distance:    Right Eye Near:   Left Eye Near:    Bilateral Near:     Physical Exam GEN:     alert, chronically ill appearing and no distress    HENT:  mucus membranes moist, oropharyngeal without lesions or erythema,  nares patent,  no nasal discharge, uvula midline EYES:   pupils equal and reactive, EOM intact NECK:  supple, normal ROM RESP:  clear to auscultation bilaterally, no increased work of breathing  CVS:   regular rate and rhythm, no murmur, distal pulses intact  ABD:  soft, non-tender;  bowel sounds present; no palpable masses,   EXT:   normal ROM, atraumatic NEURO:  alert, oriented, speech normal, CN 2-12 grossly intact, no facial droop,  sensation grossly intact, strength 5/5 bilateral UE and LE, normal coordination, normal finger to nose, resting tremor on the right Skin:   warm and dry, Psych: Normal affect, appropriate speech and behavior      UC Treatments / Results  Labs (all labs ordered are listed, but only abnormal results are displayed) Labs Reviewed  URINALYSIS, W/ REFLEX TO CULTURE (INFECTION SUSPECTED) - Abnormal; Notable for the following components:      Result Value   Hgb urine dipstick SMALL (*)    Protein, ur TRACE (*)    Bacteria, UA RARE (*)    All other components within normal limits  COMPREHENSIVE METABOLIC PANEL - Abnormal; Notable for the following components:   Sodium 134 (*)    Potassium 3.3 (*)    Chloride 97 (*)    Glucose, Bld 155 (*)    Total Protein 8.3 (*)    AST 127 (*)    ALT 138 (*)    All other components within normal limits  GLUCOSE, CAPILLARY - Abnormal; Notable for the following components:   Glucose-Capillary 164 (*)    All other components within normal limits  CBC WITH DIFFERENTIAL/PLATELET  CBG MONITORING, ED  TROPONIN I (HIGH SENSITIVITY)    EKG  If EKG performed, see my interpretation in the MDM section  Radiology No results found.    Procedures Procedures (including critical care time)  Medications Ordered in UC Medications  ketorolac (TORADOL) 30 MG/ML injection 30 mg (30 mg Intramuscular Given 11/06/22 2004)    Initial Impression / Assessment and Plan / UC Course  I have reviewed the triage vital signs and the nursing notes.  Pertinent labs & imaging results that were available during my care of the patient were reviewed by me and considered in my medical decision making (see chart for details).       Patient is a 47 y.o. female with history of type 2 diabetes, fatty liver, depression,  depression chronic low back pain who presents for several concerns.  Overall patient is nontoxic-appearing and afebrile. Obtain UA, CBG, CBC, CMP, troponin and EKG.   He was mostly concerned that she had possible DKA.  Blood sugar 164 and urinalysis without glucosuria or ketonuria.  Obtained CMP that did not show elevated anion gap.  CMP with hyperglycemia, glucose 155 with pseudohyponatremia, hypokalemia, potassium 3.3 and elevated liver transaminases.  AST 127, ALT 138 with normal bilirubin and alkaline phosphatase.  Patient with history of fatty liver.  Advised to hold Tylenol.  She is to schedule her FibroScan.   She is hypertensive.  Initial blood pressure 156/107 with repeat 159/111.  On her office visit May 28, 2022 her blood pressure was 131/88.    Patient with headache and dizziness.  Offered Toradol for headache and patient is agreeable.  IM Toradol 30 mg given.  Neurological exam is grossly unremarkable except for questionable tremor.  CT head from July 2017 was normal.  EKG showing normal sinus rhythm with left axis deviation and incomplete right bundle branch block with pulmonary disease pattern.  This is unchanged from previous EKG on 10/29/2020.  Troponin is negative.  CBC normal.  Advised patient to follow-up with her neurologist regarding new symptoms.  Strict ED precautions given and she voiced understanding.   ED and return precautions given and patient/guardian voiced understanding. Discussed MDM, treatment plan and plan for follow-up with patient who agrees with plan.    Final Clinical Impressions(s) / UC Diagnoses   Final diagnoses:  Dizziness  Hyperglycemia  Elevated blood pressure reading with diagnosis of hypertension  Tremor  Bad headache  Hypokalemia  Elevated liver transaminase level     Discharge Instructions      For your headache, tremor and dizziness: You are given a Toradol injection here.  I sent a medication to your pharmacy.  If your headache gets  worse, go to the emergency department.  Follow-up with neurology at the [facility].  You will likely need new advanced head imaging that we are not able to perform here at the urgent care.  The electrical tracing of your heart was similar to  previous tracings and  your heart enzyme was normal.  For your low potassium: Take your potassium supplements at home.  Take 1 tablet today and 2 tablets tomorrow.   For your blood sugar: You do not have diabetic ketoacidosis/DKA.  Blood sugar is elevated here but not terribly so.  Continue taking both your long and short acting insulins as previously directed by your primary care provider.  For your blood pressure: Your blood pressure was elevated here, it may be due to pain.  Be sure to take your blood pressure daily.  Your liver enzymes are elevated here in the setting of known fatty liver disease.  Be sure to see reschedule your FibroScan.             ED Prescriptions     Medication Sig Dispense Auth. Provider   naproxen (NAPROSYN) 500 MG tablet Take 1 tablet (500 mg total) by mouth 2 (two) times daily with a meal. 30 tablet Gabi Mcfate, DO      PDMP not reviewed this encounter.   Lyndee Hensen, DO 11/08/22 2023

## 2022-12-01 ENCOUNTER — Ambulatory Visit (LOCAL_COMMUNITY_HEALTH_CENTER): Payer: Self-pay

## 2022-12-01 VITALS — BP 127/84 | Ht 65.0 in | Wt 185.5 lb

## 2022-12-01 DIAGNOSIS — Z3009 Encounter for other general counseling and advice on contraception: Secondary | ICD-10-CM

## 2022-12-01 DIAGNOSIS — Z3042 Encounter for surveillance of injectable contraceptive: Secondary | ICD-10-CM

## 2022-12-01 NOTE — Progress Notes (Signed)
13 weeks 1 day post depo.  States she has been bleeding heavy for about a month, however, states she is only using a panty liner and not needing one today.  Asked if she could have something for the bleeding.  Also thinks if she comes for her depo injection closer to 11 week intervals it may help with the bleeding.  Allayne Stack, FNP, re: pt's concern with bleeding and instructed pt to take Naproxen 500 mg 2 x day x 5 days.   BP 127/84 and states she is taking her BP medicine.   Depo given IM RUOQ at pt's request as ordered by K. Newton MD dated 05/16/22; tolerated well.   Next depo due 02/16/23; appt reminder given.  Cherlynn Polo, RN

## 2023-02-28 ENCOUNTER — Ambulatory Visit (LOCAL_COMMUNITY_HEALTH_CENTER): Payer: Self-pay

## 2023-02-28 VITALS — BP 137/99 | Ht 65.0 in | Wt 181.0 lb

## 2023-02-28 DIAGNOSIS — Z3042 Encounter for surveillance of injectable contraceptive: Secondary | ICD-10-CM

## 2023-02-28 DIAGNOSIS — Z3009 Encounter for other general counseling and advice on contraception: Secondary | ICD-10-CM

## 2023-02-28 NOTE — Progress Notes (Signed)
Client 12 weeks 5 days post depo, and voices no concerns.   Client with history of High BP.  BP elevated this am at 137/99 and rechecked at 135/95.  Client reports she forgot to take BP medication this am.   Consulted with Arnetha Courser, CNM, and she approved giving DEPO today.  Counseled the patient to remember to take medication each day, and prior to coming to her appointments.  Patient verbalized understanding and reported she would take medication as soon as she gets home.  Depo administered per order by K. Newton dated 05/16/22.   Tolerated well in LUOQ.   Next Depo due 05/16/2023 next pe due 05/18/2023.  Client has reminder to schedule next depo and PE.    Harpreet Pompey Sherrilyn Rist, RN

## 2023-02-28 NOTE — Progress Notes (Signed)
Client 12 weeks 5 days post depo, and voices no concerns.   Client with history of High BP.  BP elevated this am at 137/99 and rechecked at 135/95.  Client reports she forgot to take BP medication this am.   Consulted with Arnetha Courser, CNM, and she approved giving DEPO today.  Counseled the patient to remember to take medication each day, and prior to coming to her appointments.  Patient verbalized understanding and reported she would take medication as soon as she gets home.  Depo administered per order by K. Newton dated 05/16/22.   Tolerated well in LUOQ.   Next Depo due 05/16/2023 next pe due 05/18/2023.  Client has reminder to schedule next depo and PE.    Amy Widderich, RN Consulted on the plan of care for this client.  I agree with the documented note and actions taken to provide care for this client.  Hazle Coca, CNM

## 2023-08-20 ENCOUNTER — Ambulatory Visit: Payer: Self-pay | Admitting: Advanced Practice Midwife

## 2023-08-20 ENCOUNTER — Encounter: Payer: Self-pay | Admitting: Advanced Practice Midwife

## 2023-08-20 VITALS — BP 129/88 | HR 98 | Wt 172.6 lb

## 2023-08-20 DIAGNOSIS — Z720 Tobacco use: Secondary | ICD-10-CM

## 2023-08-20 DIAGNOSIS — R87810 Cervical high risk human papillomavirus (HPV) DNA test positive: Secondary | ICD-10-CM

## 2023-08-20 DIAGNOSIS — Z30013 Encounter for initial prescription of injectable contraceptive: Secondary | ICD-10-CM

## 2023-08-20 DIAGNOSIS — Z3202 Encounter for pregnancy test, result negative: Secondary | ICD-10-CM

## 2023-08-20 DIAGNOSIS — Z3009 Encounter for other general counseling and advice on contraception: Secondary | ICD-10-CM

## 2023-08-20 MED ORDER — MEDROXYPROGESTERONE ACETATE 150 MG/ML IM SUSP
150.0000 mg | INTRAMUSCULAR | Status: AC
  Administered 2023-08-20 – 2023-11-27 (×2): 150 mg via INTRAMUSCULAR

## 2023-08-20 NOTE — Progress Notes (Signed)
 Patient is here for PE and Depo injection. FP packet given to patient and contents reviewed with the patient. Depo injection given at [facility] Deltiod and pt tolerated well to injection. Condoms declined and reminder card given. Sonda Primes, RN.

## 2023-08-20 NOTE — Progress Notes (Signed)
 [facility] [address] [place] [address] Main phone: [phone]  Family Planning Visit - Initial Visit  Subjective:  Dawn Waller is a 48 y.o. SBF exsmoker G3P0  being seen today for an initial annual visit and to discuss reproductive life planning.  The patient is currently using Abstinence for pregnancy prevention. Patient reports   does not want a pregnancy in the next year.    Patient reports they are looking for a method that provides Minimal bleeding/improved bleeding profile  Patient has the following medical conditions has Adjustment disorder with mixed anxiety and depressed mood; PTSD (post-traumatic stress disorder); Cervical high risk HPV (human papillomavirus) test positive; Fibroids; Hypertension; MDD (major depressive disorder), recurrent episode, moderate (HCC); GAD (generalized anxiety disorder); Alcohol use disorder, mild, abuse; Trichotillomania; Alcohol use disorder, moderate, dependence (HCC); At risk for prolonged QT interval syndrome; Wheezing; Tinnitus; Snoring; Asthma; Sexually transmitted disease; Pregnant state, incidental; Other and unspecified anterior pituitary hyperfunction; Onychomycosis; Obesity; Nasal congestion; Insomnia; Human papilloma virus infection; Hemorrhoids; Gastroesophageal reflux disease; Galactorrhea associated with childbirth; Flatulence, eructation and gas pain; Fatty liver; Dyspnea; Depression; Daily headache; Crystalluria; Contraceptive management; Chronic pain; Chronic low back pain; Bereavement; Anxiety; Admits to alcohol consumption; and Type 2 diabetes mellitus (HCC) on their problem list.  Chief Complaint  Patient presents with   Annual Exam    Pt is here for PE and depo injection    HPI Patient reports here for physical and DMPA reinitiation. Last PE 05/16/22. Last DMPA 02/28/23. Last pap 11/06/19 neg HPV and was due 11/2022. Pt is on antihypertensive meds and  has diabetes and takes insulin 1x/day. Last cig age 65. Last ETOH last night (2 glasses liquor). Last dental exam 2024. Highest grade completed 12th. Working 32 hrs/wk and not in school. Living alone. No menses with DMPA. Last sex 3 years ago.  Patient denies vaping, cigars, MJ  Review of Systems  All other systems reviewed and are negative.   Diabetes screening This patient is 48 y.o. with a BMI of Body mass index is 28.72 kg/m.SABRA  Is patient eligible for diabetes screening (age >35 and BMI >25)?  yes  Was Hgb A1c ordered? Pt declines  STI screening Patient reports 0 of partners in last year.  Does this patient desire STI screening?  No - pt declines all STD testing and pap  Hepatitis C screening Has patient been screened once for HCV in the past?  No  No results found for: HCVAB  Does the patient meet criteria for HCV testing? No  (If yes-- Screen for HCV through [facility]) Criteria:  Since the last HCV result, does the patient have any of the following? - Current drug use - Have a partner with drug use - Has been incarcerated  Hepatitis B screening Does the patient meet criteria for HBV testing? No Criteria:  -Household, sexual or needle sharing contact with HBV -History of drug use -HIV positive -Those with known Hep C  Cervical Cancer Screening  No results found for: DIAGPAP, HPVHIGH, ADEQPAP Lab Results  Component Value Date   SPECADGYN Comment 11/06/2019   Result Date Procedure Results Follow-ups  11/06/2019 IGP, Aptima HPV DIAGNOSIS:: Comment Specimen adequacy:: Comment Clinician Provided ICD10: Comment Performed by:: Comment PAP Smear Comment: . Note:: Comment Test Methodology: Comment HPV Aptima: Negative   08/28/2018 Pap IG and HPV (high risk) DNA detection Pap Smear: NILM HPV: HRHPV + due 08/30/2019  08/28/2018 HM PAP SMEAR HM Pap  smear: Negative, HPV positive     Health Maintenance Due  Topic Date Due   HEMOGLOBIN A1C  Never done    Pneumococcal Vaccine 32-28 Years old (1 of 2 - PCV) Never done   FOOT EXAM  Never done   OPHTHALMOLOGY EXAM  Never done   Diabetic kidney evaluation - Urine ACR  Never done   Hepatitis C Screening  Never done   INFLUENZA VACCINE  03/08/2023   Colonoscopy  Never done   COVID-19 Vaccine (1 - 2024-25 season) Never done    The following portions of the patient's history were reviewed and updated as appropriate: allergies, current medications, past family history, past medical history, past social history, past surgical history and problem list. Problem list updated.  See flowsheet for other program required questions.  Objective:   Vitals:   08/20/23 1610  BP: 129/88  Pulse: 98  Weight: 172 lb 9.6 oz (78.3 kg)    Physical Exam Constitutional:      Appearance: Normal appearance. She is obese.  HENT:     Head: Normocephalic and atraumatic.     Mouth/Throat:     Mouth: Mucous membranes are moist.     Comments: Last dental exam 2024 Eyes:     Conjunctiva/sclera: Conjunctivae normal.  Neck:     Thyroid: No thyroid mass, thyromegaly or thyroid tenderness.  Cardiovascular:     Rate and Rhythm: Normal rate and regular rhythm.  Pulmonary:     Effort: Pulmonary effort is normal.     Breath sounds: Normal breath sounds.  Chest:  Breasts:    Right: Normal.     Left: Normal.  Abdominal:     Palpations: Abdomen is soft.     Comments: Soft without masses or tenderness/increased adipose  Genitourinary:    Pubic Area: No pubic lice.      Rectum: Normal.     Comments: Pt declines pap, pelvic exam or STD check Pt declines need for chaperone for exam  Musculoskeletal:        General: Normal range of motion.     Cervical back: Normal range of motion and neck supple.  Skin:    General: Skin is warm and dry.  Neurological:     Mental Status: She is alert.  Psychiatric:        Mood and Affect: Mood normal.     Assessment and Plan:  Dawn Waller is a 48 y.o. female  presenting to the [facility] Department for an initial annual wellness/contraceptive visit  Contraception counseling: Reviewed options based on patient desire and reproductive life plan. Patient is interested in Hormonal Injection. This was provided to the patient today.  if not why not clearly documented  Risks, benefits, and typical effectiveness rates were reviewed.  Questions were answered.  Written information was also given to the patient to review.    The patient will follow up in  11-13 weeks for surveillance.  The patient was told to call with any further questions, or with any concerns about this method of contraception.  Emphasized use of condoms 100% of the time for STI prevention.  Educated on ECP and assessed for need of ECP. Patient reported not meeting criteria.  Reviewed options and patient desired No method of ECP, declined all    1. Family planning (Primary) Pt states she wants to have pap done at [facility]; pt counseled last pap 11/06/19 and was due 11/2022 Immunization nurse consult  - Pregnancy, urine  2. Encounter for initial  prescription of injectable contraceptive May have DMPA 150 mg IM q 11-13 wks x 1 year if PT neg today Please counsel on need for abstinance next 7 days  3. Cervical high risk HPV (human papillomavirus) test positive    Return for 11-13 wk DMPA.  No future appointments.  Almarie DELENA Hillier, CNM

## 2023-08-21 LAB — PREGNANCY, URINE: Preg Test, Ur: NEGATIVE

## 2023-11-27 ENCOUNTER — Ambulatory Visit (LOCAL_COMMUNITY_HEALTH_CENTER): Payer: Self-pay

## 2023-11-27 VITALS — BP 134/94 | Ht 65.0 in | Wt 174.0 lb

## 2023-11-27 DIAGNOSIS — Z3009 Encounter for other general counseling and advice on contraception: Secondary | ICD-10-CM

## 2023-11-27 DIAGNOSIS — Z3042 Encounter for surveillance of injectable contraceptive: Secondary | ICD-10-CM

## 2023-11-27 NOTE — Progress Notes (Signed)
 14w 1d post depo. Voices no concerns. Depo given today per order by Azzie Bollman, CNM dated 08/20/2023. Tolerated well in R deltoid. Next depo due 02/12/2024; patient aware.   Clare Critchley, RN
